# Patient Record
Sex: Female | Born: 1975 | Race: Black or African American | Hispanic: No | Marital: Single | Smoking: Never smoker
Health system: Southern US, Community
[De-identification: ages and names within clinical notes are randomized; demographics above are authoritative.]

## PROBLEM LIST (undated history)

## (undated) DIAGNOSIS — C73 Malignant neoplasm of thyroid gland: Secondary | ICD-10-CM

## (undated) DIAGNOSIS — G43909 Migraine, unspecified, not intractable, without status migrainosus: Secondary | ICD-10-CM

## (undated) DIAGNOSIS — E039 Hypothyroidism, unspecified: Secondary | ICD-10-CM

## (undated) DIAGNOSIS — D6800 Von Willebrand disease, unspecified: Secondary | ICD-10-CM

## (undated) HISTORY — PX: CHOLECYSTECTOMY: SHX55

## (undated) HISTORY — PX: THYROIDECTOMY: SHX17

---

## 1999-09-18 ENCOUNTER — Emergency Department (HOSPITAL_COMMUNITY): Admission: EM | Admit: 1999-09-18 | Discharge: 1999-09-18 | Payer: Self-pay | Admitting: Emergency Medicine

## 1999-09-18 ENCOUNTER — Encounter: Payer: Self-pay | Admitting: Emergency Medicine

## 1999-11-24 ENCOUNTER — Other Ambulatory Visit: Admission: RE | Admit: 1999-11-24 | Discharge: 1999-11-24 | Payer: Self-pay | Admitting: Obstetrics and Gynecology

## 1999-12-06 ENCOUNTER — Inpatient Hospital Stay (HOSPITAL_COMMUNITY): Admission: AD | Admit: 1999-12-06 | Discharge: 1999-12-09 | Payer: Self-pay | Admitting: *Deleted

## 2000-02-25 ENCOUNTER — Inpatient Hospital Stay (HOSPITAL_COMMUNITY): Admission: AD | Admit: 2000-02-25 | Discharge: 2000-02-25 | Payer: Self-pay | Admitting: *Deleted

## 2000-05-03 ENCOUNTER — Encounter: Payer: Self-pay | Admitting: Obstetrics and Gynecology

## 2000-05-03 ENCOUNTER — Inpatient Hospital Stay (HOSPITAL_COMMUNITY): Admission: AD | Admit: 2000-05-03 | Discharge: 2000-05-03 | Payer: Self-pay | Admitting: Obstetrics and Gynecology

## 2000-05-15 ENCOUNTER — Ambulatory Visit (HOSPITAL_COMMUNITY): Admission: RE | Admit: 2000-05-15 | Discharge: 2000-05-15 | Payer: Self-pay | Admitting: Obstetrics and Gynecology

## 2000-05-18 ENCOUNTER — Encounter (INDEPENDENT_AMBULATORY_CARE_PROVIDER_SITE_OTHER): Payer: Self-pay | Admitting: Specialist

## 2000-05-18 ENCOUNTER — Inpatient Hospital Stay (HOSPITAL_COMMUNITY): Admission: AD | Admit: 2000-05-18 | Discharge: 2000-05-23 | Payer: Self-pay | Admitting: Obstetrics and Gynecology

## 2000-05-31 ENCOUNTER — Encounter: Admission: RE | Admit: 2000-05-31 | Discharge: 2000-08-29 | Payer: Self-pay | Admitting: Obstetrics and Gynecology

## 2000-06-27 ENCOUNTER — Other Ambulatory Visit: Admission: RE | Admit: 2000-06-27 | Discharge: 2000-06-27 | Payer: Self-pay | Admitting: Ophthalmology

## 2001-07-12 ENCOUNTER — Encounter: Admission: RE | Admit: 2001-07-12 | Discharge: 2001-07-12 | Payer: Self-pay | Admitting: Internal Medicine

## 2001-07-12 ENCOUNTER — Encounter: Payer: Self-pay | Admitting: Internal Medicine

## 2002-02-27 ENCOUNTER — Emergency Department (HOSPITAL_COMMUNITY): Admission: EM | Admit: 2002-02-27 | Discharge: 2002-02-27 | Payer: Self-pay

## 2002-11-12 ENCOUNTER — Other Ambulatory Visit: Admission: RE | Admit: 2002-11-12 | Discharge: 2002-11-12 | Payer: Self-pay | Admitting: Internal Medicine

## 2003-07-24 ENCOUNTER — Emergency Department (HOSPITAL_COMMUNITY): Admission: EM | Admit: 2003-07-24 | Discharge: 2003-07-24 | Payer: Self-pay | Admitting: Emergency Medicine

## 2003-10-05 ENCOUNTER — Emergency Department (HOSPITAL_COMMUNITY): Admission: AD | Admit: 2003-10-05 | Discharge: 2003-10-05 | Payer: Self-pay | Admitting: Family Medicine

## 2003-11-10 ENCOUNTER — Emergency Department (HOSPITAL_COMMUNITY): Admission: AD | Admit: 2003-11-10 | Discharge: 2003-11-10 | Payer: Self-pay | Admitting: Family Medicine

## 2004-03-02 ENCOUNTER — Emergency Department (HOSPITAL_COMMUNITY): Admission: EM | Admit: 2004-03-02 | Discharge: 2004-03-02 | Payer: Self-pay | Admitting: Emergency Medicine

## 2004-07-22 ENCOUNTER — Other Ambulatory Visit: Admission: RE | Admit: 2004-07-22 | Discharge: 2004-07-22 | Payer: Self-pay | Admitting: Internal Medicine

## 2005-02-17 ENCOUNTER — Encounter (INDEPENDENT_AMBULATORY_CARE_PROVIDER_SITE_OTHER): Payer: Self-pay | Admitting: *Deleted

## 2005-02-17 ENCOUNTER — Ambulatory Visit (HOSPITAL_COMMUNITY): Admission: RE | Admit: 2005-02-17 | Discharge: 2005-02-17 | Payer: Self-pay | Admitting: Internal Medicine

## 2005-03-30 ENCOUNTER — Encounter: Admission: RE | Admit: 2005-03-30 | Discharge: 2005-03-30 | Payer: Self-pay | Admitting: Internal Medicine

## 2005-04-28 ENCOUNTER — Other Ambulatory Visit: Admission: RE | Admit: 2005-04-28 | Discharge: 2005-04-28 | Payer: Self-pay | Admitting: Obstetrics and Gynecology

## 2005-09-01 ENCOUNTER — Emergency Department (HOSPITAL_COMMUNITY): Admission: EM | Admit: 2005-09-01 | Discharge: 2005-09-01 | Payer: Self-pay | Admitting: Emergency Medicine

## 2005-10-27 ENCOUNTER — Emergency Department (HOSPITAL_COMMUNITY): Admission: EM | Admit: 2005-10-27 | Discharge: 2005-10-28 | Payer: Self-pay | Admitting: Emergency Medicine

## 2006-02-01 ENCOUNTER — Emergency Department (HOSPITAL_COMMUNITY): Admission: EM | Admit: 2006-02-01 | Discharge: 2006-02-01 | Payer: Self-pay | Admitting: *Deleted

## 2006-02-03 ENCOUNTER — Emergency Department (HOSPITAL_COMMUNITY): Admission: EM | Admit: 2006-02-03 | Discharge: 2006-02-03 | Payer: Self-pay | Admitting: Emergency Medicine

## 2006-02-07 ENCOUNTER — Other Ambulatory Visit: Admission: RE | Admit: 2006-02-07 | Discharge: 2006-02-07 | Payer: Self-pay | Admitting: Obstetrics and Gynecology

## 2006-02-12 ENCOUNTER — Emergency Department (HOSPITAL_COMMUNITY): Admission: EM | Admit: 2006-02-12 | Discharge: 2006-02-12 | Payer: Self-pay | Admitting: Emergency Medicine

## 2006-04-15 ENCOUNTER — Emergency Department (HOSPITAL_COMMUNITY): Admission: EM | Admit: 2006-04-15 | Discharge: 2006-04-15 | Payer: Self-pay | Admitting: Emergency Medicine

## 2006-04-16 ENCOUNTER — Emergency Department (HOSPITAL_COMMUNITY): Admission: EM | Admit: 2006-04-16 | Discharge: 2006-04-16 | Payer: Self-pay | Admitting: Emergency Medicine

## 2006-08-24 ENCOUNTER — Other Ambulatory Visit: Admission: RE | Admit: 2006-08-24 | Discharge: 2006-08-24 | Payer: Self-pay | Admitting: Obstetrics and Gynecology

## 2006-10-22 ENCOUNTER — Emergency Department (HOSPITAL_COMMUNITY): Admission: EM | Admit: 2006-10-22 | Discharge: 2006-10-22 | Payer: Self-pay | Admitting: Family Medicine

## 2007-06-22 ENCOUNTER — Emergency Department (HOSPITAL_COMMUNITY): Admission: EM | Admit: 2007-06-22 | Discharge: 2007-06-22 | Payer: Self-pay | Admitting: Emergency Medicine

## 2007-10-12 ENCOUNTER — Encounter: Admission: RE | Admit: 2007-10-12 | Discharge: 2007-10-12 | Payer: Self-pay | Admitting: Internal Medicine

## 2007-10-30 ENCOUNTER — Emergency Department (HOSPITAL_COMMUNITY): Admission: EM | Admit: 2007-10-30 | Discharge: 2007-10-30 | Payer: Self-pay | Admitting: Family Medicine

## 2007-12-16 ENCOUNTER — Emergency Department (HOSPITAL_COMMUNITY): Admission: EM | Admit: 2007-12-16 | Discharge: 2007-12-17 | Payer: Self-pay | Admitting: Emergency Medicine

## 2008-02-23 ENCOUNTER — Emergency Department (HOSPITAL_BASED_OUTPATIENT_CLINIC_OR_DEPARTMENT_OTHER): Admission: EM | Admit: 2008-02-23 | Discharge: 2008-02-23 | Payer: Self-pay | Admitting: Emergency Medicine

## 2008-06-12 ENCOUNTER — Emergency Department (HOSPITAL_BASED_OUTPATIENT_CLINIC_OR_DEPARTMENT_OTHER): Admission: EM | Admit: 2008-06-12 | Discharge: 2008-06-12 | Payer: Self-pay | Admitting: Emergency Medicine

## 2008-09-10 ENCOUNTER — Inpatient Hospital Stay (HOSPITAL_COMMUNITY): Admission: EM | Admit: 2008-09-10 | Discharge: 2008-09-13 | Payer: Self-pay | Admitting: Emergency Medicine

## 2008-09-10 ENCOUNTER — Ambulatory Visit (HOSPITAL_COMMUNITY): Admission: RE | Admit: 2008-09-10 | Discharge: 2008-09-10 | Payer: Self-pay | Admitting: Internal Medicine

## 2008-11-10 ENCOUNTER — Encounter (INDEPENDENT_AMBULATORY_CARE_PROVIDER_SITE_OTHER): Payer: Self-pay | Admitting: Surgery

## 2008-11-10 ENCOUNTER — Ambulatory Visit (HOSPITAL_COMMUNITY): Admission: RE | Admit: 2008-11-10 | Discharge: 2008-11-11 | Payer: Self-pay | Admitting: Surgery

## 2009-04-07 ENCOUNTER — Ambulatory Visit: Payer: Self-pay | Admitting: Diagnostic Radiology

## 2009-04-07 ENCOUNTER — Emergency Department (HOSPITAL_BASED_OUTPATIENT_CLINIC_OR_DEPARTMENT_OTHER): Admission: EM | Admit: 2009-04-07 | Discharge: 2009-04-07 | Payer: Self-pay | Admitting: Emergency Medicine

## 2009-08-04 ENCOUNTER — Emergency Department (HOSPITAL_BASED_OUTPATIENT_CLINIC_OR_DEPARTMENT_OTHER): Admission: EM | Admit: 2009-08-04 | Discharge: 2009-08-04 | Payer: Self-pay | Admitting: Emergency Medicine

## 2009-08-04 ENCOUNTER — Ambulatory Visit: Payer: Self-pay | Admitting: Diagnostic Radiology

## 2010-04-14 ENCOUNTER — Emergency Department (HOSPITAL_BASED_OUTPATIENT_CLINIC_OR_DEPARTMENT_OTHER): Admission: EM | Admit: 2010-04-14 | Discharge: 2010-04-14 | Payer: Self-pay | Admitting: Emergency Medicine

## 2010-04-25 ENCOUNTER — Emergency Department (HOSPITAL_BASED_OUTPATIENT_CLINIC_OR_DEPARTMENT_OTHER): Admission: EM | Admit: 2010-04-25 | Discharge: 2010-04-25 | Payer: Self-pay | Admitting: Emergency Medicine

## 2010-11-04 LAB — DIFFERENTIAL
Eosinophils Relative: 1 % (ref 0–5)
Lymphs Abs: 2 10*3/uL (ref 0.7–4.0)
Monocytes Relative: 10 % (ref 3–12)
Neutro Abs: 3.4 10*3/uL (ref 1.7–7.7)
Neutrophils Relative %: 56 % (ref 43–77)

## 2010-11-04 LAB — CBC
HCT: 32.1 % — ABNORMAL LOW (ref 36.0–46.0)
MCHC: 34.2 g/dL (ref 30.0–36.0)
RBC: 3.88 MIL/uL (ref 3.87–5.11)
RDW: 15.4 % (ref 11.5–15.5)
WBC: 6.3 10*3/uL (ref 4.0–10.5)

## 2010-11-04 LAB — URINALYSIS, ROUTINE W REFLEX MICROSCOPIC
Bilirubin Urine: NEGATIVE
Hgb urine dipstick: NEGATIVE
Nitrite: NEGATIVE
Specific Gravity, Urine: 1.04 — ABNORMAL HIGH (ref 1.005–1.030)
Urobilinogen, UA: 1 mg/dL (ref 0.0–1.0)

## 2010-11-04 LAB — BASIC METABOLIC PANEL
BUN: 11 mg/dL (ref 6–23)
CO2: 27 mEq/L (ref 19–32)
Creatinine, Ser: 0.9 mg/dL (ref 0.4–1.2)
GFR calc Af Amer: >60 mL/min (ref >60)
GFR calc non Af Amer: >60 mL/min (ref >60)
Glucose, Bld: 88 mg/dL (ref 70–99)
Potassium: 3.9 mEq/L (ref 3.5–5.1)
Sodium: 141 mEq/L (ref 135–145)

## 2010-11-04 LAB — URINE MICROSCOPIC-ADD ON

## 2010-11-23 LAB — WET PREP, GENITAL: Yeast Wet Prep HPF POC: NONE SEEN

## 2010-11-23 LAB — CBC
HCT: 32.6 % — ABNORMAL LOW (ref 36.0–46.0)
Hemoglobin: 11 g/dL — ABNORMAL LOW (ref 12.0–15.0)
RBC: 3.97 MIL/uL (ref 3.87–5.11)
RDW: 15.4 % (ref 11.5–15.5)

## 2010-11-23 LAB — DIFFERENTIAL
Basophils Absolute: 0 10*3/uL (ref 0.0–0.1)
Eosinophils Relative: 1 % (ref 0–5)
Lymphocytes Relative: 29 % (ref 12–46)
Monocytes Absolute: 0.4 10*3/uL (ref 0.1–1.0)
Monocytes Relative: 6 % (ref 3–12)

## 2010-11-23 LAB — URINALYSIS, ROUTINE W REFLEX MICROSCOPIC
Protein, ur: NEGATIVE mg/dL
Urobilinogen, UA: 0.2 mg/dL (ref 0.0–1.0)

## 2010-11-23 LAB — BASIC METABOLIC PANEL
CO2: 26 mEq/L (ref 19–32)
GFR calc non Af Amer: >60 mL/min (ref >60)
Glucose, Bld: 81 mg/dL (ref 70–99)
Potassium: 3.9 mEq/L (ref 3.5–5.1)
Sodium: 140 mEq/L (ref 135–145)

## 2010-11-23 LAB — GC/CHLAMYDIA PROBE AMP, GENITAL: GC Probe Amp, Genital: NEGATIVE

## 2010-11-27 LAB — GC/CHLAMYDIA PROBE AMP, GENITAL: GC Probe Amp, Genital: NEGATIVE

## 2010-11-27 LAB — DIFFERENTIAL
Basophils Relative: 1 % (ref 0–1)
Eosinophils Absolute: 0.1 10*3/uL (ref 0.0–0.7)
Eosinophils Relative: 2 % (ref 0–5)
Lymphs Abs: 2.6 10*3/uL (ref 0.7–4.0)
Monocytes Relative: 5 % (ref 3–12)
Neutrophils Relative %: 32 % — ABNORMAL LOW (ref 43–77)

## 2010-11-27 LAB — WET PREP, GENITAL: Trich, Wet Prep: NONE SEEN

## 2010-11-27 LAB — URINALYSIS, ROUTINE W REFLEX MICROSCOPIC
Bilirubin Urine: NEGATIVE
Glucose, UA: NEGATIVE mg/dL
Specific Gravity, Urine: 1.016 (ref 1.005–1.030)
pH: 7 (ref 5.0–8.0)

## 2010-11-27 LAB — COMPREHENSIVE METABOLIC PANEL
ALT: <6 U/L (ref 0–35)
AST: 18 U/L (ref 0–37)
Alkaline Phosphatase: 84 U/L (ref 39–117)
CO2: 28 mEq/L (ref 19–32)
GFR calc Af Amer: >60 mL/min (ref >60)
GFR calc non Af Amer: >60 mL/min (ref >60)
Glucose, Bld: 112 mg/dL — ABNORMAL HIGH (ref 70–99)
Potassium: 3.5 mEq/L (ref 3.5–5.1)
Sodium: 139 mEq/L (ref 135–145)

## 2010-11-27 LAB — LIPASE, BLOOD: Lipase: 85 U/L (ref 23–300)

## 2010-11-27 LAB — URINE MICROSCOPIC-ADD ON

## 2010-11-27 LAB — CBC
Hemoglobin: 11.6 g/dL — ABNORMAL LOW (ref 12.0–15.0)
RBC: 4.27 MIL/uL (ref 3.87–5.11)
WBC: 4.3 10*3/uL (ref 4.0–10.5)

## 2010-12-02 LAB — PREGNANCY, URINE: Preg Test, Ur: NEGATIVE

## 2010-12-02 LAB — COMPREHENSIVE METABOLIC PANEL
ALT: 15 U/L (ref 0–35)
AST: 16 U/L (ref 0–37)
Albumin: 3.5 g/dL (ref 3.5–5.2)
Alkaline Phosphatase: 75 U/L (ref 39–117)
CO2: 29 mEq/L (ref 19–32)
Chloride: 101 mEq/L (ref 96–112)
Creatinine, Ser: 0.68 mg/dL (ref 0.4–1.2)
GFR calc Af Amer: >60 mL/min (ref >60)
GFR calc non Af Amer: >60 mL/min (ref >60)
Potassium: 3.7 mEq/L (ref 3.5–5.1)
Total Bilirubin: 0.1 mg/dL — ABNORMAL LOW (ref 0.3–1.2)

## 2010-12-02 LAB — DIFFERENTIAL
Basophils Absolute: 0 10*3/uL (ref 0.0–0.1)
Basophils Relative: 1 % (ref 0–1)
Eosinophils Absolute: 0.1 10*3/uL (ref 0.0–0.7)
Eosinophils Relative: 2 % (ref 0–5)
Lymphocytes Relative: 34 % (ref 12–46)
Monocytes Absolute: 0.5 10*3/uL (ref 0.1–1.0)

## 2010-12-02 LAB — CBC
MCV: 84.5 fL (ref 78.0–100.0)
Platelets: 231 10*3/uL (ref 150–400)
RBC: 4.04 MIL/uL (ref 3.87–5.11)
WBC: 5.3 10*3/uL (ref 4.0–10.5)

## 2010-12-02 LAB — PROTIME-INR: Prothrombin Time: 13.3 seconds (ref 11.6–15.2)

## 2010-12-06 LAB — HCG, QUANTITATIVE, PREGNANCY: hCG, Beta Chain, Quant, S: 104541 m[IU]/mL — ABNORMAL HIGH (ref <5)

## 2010-12-06 LAB — DIFFERENTIAL
Basophils Absolute: 0 10*3/uL (ref 0.0–0.1)
Basophils Relative: 1 % (ref 0–1)
Lymphocytes Relative: 31 % (ref 12–46)
Monocytes Relative: 10 % (ref 3–12)
Neutro Abs: 2.9 10*3/uL (ref 1.7–7.7)
Neutrophils Relative %: 58 % (ref 43–77)

## 2010-12-06 LAB — CBC
HCT: 31.8 % — ABNORMAL LOW (ref 36.0–46.0)
HCT: 37.2 % (ref 36.0–46.0)
Hemoglobin: 10.7 g/dL — ABNORMAL LOW (ref 12.0–15.0)
Hemoglobin: 12.1 g/dL (ref 12.0–15.0)
MCHC: 33.5 g/dL (ref 30.0–36.0)
MCV: 83.6 fL (ref 78.0–100.0)
MCV: 84.2 fL (ref 78.0–100.0)
Platelets: 203 10*3/uL (ref 150–400)
Platelets: 229 10*3/uL (ref 150–400)
RBC: 3.81 MIL/uL — ABNORMAL LOW (ref 3.87–5.11)
RDW: 14.7 % (ref 11.5–15.5)
RDW: 15.3 % (ref 11.5–15.5)
WBC: 3.5 10*3/uL — ABNORMAL LOW (ref 4.0–10.5)

## 2010-12-06 LAB — WET PREP, GENITAL
Trich, Wet Prep: NONE SEEN
Yeast Wet Prep HPF POC: NONE SEEN

## 2010-12-06 LAB — COMPREHENSIVE METABOLIC PANEL
AST: 20 U/L (ref 0–37)
Albumin: 2.8 g/dL — ABNORMAL LOW (ref 3.5–5.2)
Alkaline Phosphatase: 70 U/L (ref 39–117)
Alkaline Phosphatase: 90 U/L (ref 39–117)
BUN: 6 mg/dL (ref 6–23)
CO2: 25 mEq/L (ref 19–32)
Chloride: 106 mEq/L (ref 96–112)
Creatinine, Ser: 0.74 mg/dL (ref 0.4–1.2)
Creatinine, Ser: 0.74 mg/dL (ref 0.4–1.2)
GFR calc Af Amer: >60 mL/min (ref >60)
GFR calc non Af Amer: >60 mL/min (ref >60)
Glucose, Bld: 82 mg/dL (ref 70–99)
Potassium: 3.5 mEq/L (ref 3.5–5.1)
Potassium: 3.8 mEq/L (ref 3.5–5.1)
Total Bilirubin: 0.4 mg/dL (ref 0.3–1.2)
Total Bilirubin: 1.1 mg/dL (ref 0.3–1.2)
Total Protein: 7.3 g/dL (ref 6.0–8.3)

## 2010-12-06 LAB — URINE MICROSCOPIC-ADD ON

## 2010-12-06 LAB — ABO/RH: ABO/RH(D): O POS

## 2010-12-06 LAB — URINALYSIS, ROUTINE W REFLEX MICROSCOPIC
Glucose, UA: NEGATIVE mg/dL
Hgb urine dipstick: NEGATIVE
Ketones, ur: 15 mg/dL — AB
Protein, ur: 30 mg/dL — AB

## 2010-12-06 LAB — GC/CHLAMYDIA PROBE AMP, GENITAL: GC Probe Amp, Genital: NEGATIVE

## 2010-12-06 LAB — POCT PREGNANCY, URINE: Preg Test, Ur: POSITIVE

## 2011-01-04 NOTE — Discharge Summary (Signed)
[name], [name]                ACCOUNT NO.:  [id number]   MEDICAL RECORD NO.:  [id number]          PATIENT TYPE:  INP   LOCATION:  5151                         FACILITY:  [facility]   PHYSICIAN:  Kristen Boyd, M.D.DATE OF BIRTH:  1976-05-04   DATE OF ADMISSION:  09/10/2008  DATE OF DISCHARGE:  09/13/2008                               DISCHARGE SUMMARY   ADMITTING PHYSICIAN:  Kristen Arms. Corliss Skains, MD   DISCHARGING PHYSICIAN:  Kristen Pu. Cornett, MD   CHIEF COMPLAINT AND REASON FOR ADMISSION:  Ms. Kristen Boyd is a 32-year female  patient who recently diagnosed as being pregnant, gestational age of the  fetus is 7-1/2 weeks, has had right upper quadrant pain for several  months usually after eating especially a spicy foods, intermittently  associated with nausea and vomiting.  She has not had a prior workup.  She presented to the ER with several days of persistent pain and nausea  and vomiting.  An ultrasound done on the ER revealed distended  gallbladder with internal echoes, possible sludge, no wall thickening,  and normal common bile duct.  LFTs were normal.  Other labs were normal  including lipase.  Her serum hCG was greater than 10,000 consistent with  pregnancy.  The patient was admitted with the following diagnoses.  1. Biliary colic.  2. First trimester pregnancy.  3. History of suspected volume depletion secondary to nausea and      vomiting.   HOSPITAL COURSE:  The patient was admitted and was placed empirically on  IV cefoxitin, which was okayed by pharmacist for first trimester  pregnancy.  She was placed on n.p.o. status and IV fluids and pain  management.  Throughout the hospitalization, the patient's labs remained  normal.  She continue with difficulty with right upper quadrant pain and  nausea.  Some of the nausea was felt to be more related to first  trimester pregnancy.  Because she has a first trimester pregnancy, she  was not a candidate for operative intervention at  this time.  Per the  patient request and per surgeon's request, the residents with the [facility] will call, they were overwhelmed in hospital.  The  patient is unassigned regarding her GYN care and is not followed up here  with a gynecologist or obstetrician.  I spoke with them and then later  Kristen Boyd spoke with Kristen Boyd and when then she felt at this  point yes it is appropriate to hold off on surgical intervention because  the patient is first trimester.  There are no signs and symptoms based  on what he has reviewed and he was explained to him per the physician to  indicate the patient needs to terminate the pregnancy because of this  illness and otherwise, she can follow up with the preferred  obstetrician, gynecologist after discharge or she may follow up with  them, numbers have been given.   By September 13, 2008, the patient was doing well, was tolerating a low-  fat diet, although she was still having some nausea which again was felt  more related  to the pregnancy and not to biliary colic.  Plans were for  the patient to follow up with Kristen Boyd in 1-2 weeks and follow up her  private physician Kristen Boyd and establish with an obstetrician,  gynecologist for further management of the pregnancy.  In addition, the  patient is a high-risk pregnancy and potential high-risk surgery because  she has a history of von Willebrand disease and therefore does not take  aspirin products accordingly.   FINAL DISCHARGE DIAGNOSES:  1. Biliary colic, resolving with ultrasound consistent with possible      sludge, otherwise normal ultrasound.  2. First trimester pregnancy, 8 weeks fetal gestational age at time of      discharge.   DISCHARGE MEDICATIONS:  The patient was not on home medications.  1. She will go home on Percocet 5/325 one to two tablets every 4 hours      as needed for pain.  2. Zofran 4 mg every 4-6 hours as needed for nausea.   OTHER INSTRUCTIONS:   Low-fat diet, hydrate as much as possible.   FOLLOWUP:  Need to call Kristen Boyd office at  [phone], to be seen in 1-  2 weeks.  She needs to see Kristen Boyd in 1-2 weeks and she needs to  follow up with either the ER OB/GYN or contact a local office to  establish obstetrical because of her pregnancy.      Kristen Boyd, N.P.      Kristen Boyd, M.D.  Electronically Signed    ALE/MEDQ  D:  10/27/2008  T:  10/27/2008  Job:  [id number]   cc:   Kristen Boyd, M.D.  Kristen Housekeeper, MD

## 2011-01-04 NOTE — Op Note (Signed)
Kristen Boyd, Kristen Boyd                ACCOUNT NO.:  [id number]   MEDICAL RECORD NO.:  [id number]          PATIENT TYPE:  OIB   LOCATION:  1524                         FACILITY:  [facility]   PHYSICIAN:  Wilmon Arms. Corliss Skains, M.D. DATE OF BIRTH:  12/12/1975   DATE OF PROCEDURE:  11/10/2008  DATE OF DISCHARGE:                               OPERATIVE REPORT   PREOPERATIVE DIAGNOSIS:  Chronic cholecystitis.   POSTOPERATIVE DIAGNOSIS:  Chronic cholecystitis.   PROCEDURE PERFORMED:  Laparoscopic cholecystectomy with intraoperative  cholangiogram.   SURGEON:  Wilmon Arms. Corliss Skains, M.D.   ASSISTANT:  Anselm Pancoast. Zachery Dakins, M.D.   ANESTHESIA:  General endotracheal.   INDICATIONS:  The patient is a 35 year old female who was initially  admitted to [facility] on 08/2008 with biliary colic.  An ultrasound  showed some gallbladder sludge but no wall thickening.  She has symptoms  after eating greasy spicy foods associated nausea and vomiting.  At the  time of her admission, she was found to be pregnant.  Her symptoms  improved on antibiotics.  She has subsequently terminated the pregnancy.  She presents now for laparoscopic cholecystectomy.  Of note, the patient  does have Von Willebrand's disease and was given DDAVP just prior to her  procedures today.   DESCRIPTION OF PROCEDURE:  The patient brought to the operating placed  in supine position on operating table.  After an adequate level of  general anesthesia was obtained, the patient's abdomen was prepped with  Betadine and draped in sterile fashion.  A time-out was taken assure  proper patient, proper procedure.  The area below umbilicus infiltrated  with 0.25% Marcaine with epinephrine.  She has a previous scar here.  Dissection was carried down through the subcutaneous tissues to the  fascia.  The fascia was grasped with Kocher clamps and elevated.  We  divided the fascia vertically.  We entered the peritoneal cavity  bluntly.  A stay suture of 0  Vicryl was placed around the fascial  opening.  The pneumoperitoneum is obtained by insufflating CO2,  maintaining maximal pressure of 15 mmHg.  Laparoscope was inserted and  the patient was positioned in reverse Trendelenburg tilted to her left.  An 11-mm port was placed in subxiphoid position.  Two 5 mm ports placed  in right upper quadrant.  The gallbladder grasped with clamp and  elevated over the edge of the liver.  There were lot of omental  adhesions to the surface of gallbladder.  These were taken down with  sharp dissection as well as cautery.  A little bit of the capsule of the  liver was inadvertently stripped away but this was cauterized for  hemostasis.  We were able to expose the hilum of gallbladder.  I bluntly  dissected around the cystic duct and cystic artery.  The cystic artery  was ligated with clips.  The cystic duct was ligated, clipped distally.  A small opening was created on side of the cystic duct.  A Cook  cholangiogram catheter was then inserted through a stab incision and  threaded into the cystic duct.  This was  secured with a clip.  A  cholangiogram was obtained which showed good flow proximally and  distally in the biliary tree with no sign of filling defect or  obstruction.  Contrast flowed easily into duodenum.  The catheter was  removed the cystic duct was ligated clips and divided.  The cystic  artery was divided.  A small posterior branch was also identified and  was ligated with clips and divided.  Cautery was then used to remove the  gallbladder from the liver bed.  We carefully inspected the gallbladder  fossa for hemostasis.  We thoroughly irrigated the right upper quadrant  and suctioned out as much as possible.  The gallbladder was placed in an  EndoCatch sac and removed through the umbilical port site.  The fascia  was closed with a pursestring suture.  We again inspected the right  upper quadrant.  Pneumoperitoneum is then released as we removed  the  trocars.  4-0 Monocryl was used to close skin incisions.  Steri-Strips  and clean dressings were applied.  The patient was then extubated and  brought to recovery room in stable condition.  All sponge, instrument  and needle counts were correct.      Wilmon Arms. Tsuei, M.D.  Electronically Signed     MKT/MEDQ  D:  11/10/2008  T:  11/10/2008  Job:  [id number]

## 2011-01-07 NOTE — Discharge Summary (Signed)
[facility]  Patient:    Kristen Boyd, Kristen Boyd                       MRN: [id number] Adm. Date:  [id number] Disc. Date: [id number] Attending:  Donne Hazel                           Discharge Summary  ADDITIONAL DIAGNOSES:         1. Intrauterine pregnancy, at 12 weeks estimated                                  gestational age.                               2. Hyperemesis gravida derm.  REASON FOR ADMISSION:         This patient is a 35 year old black female, G1, P0, at 83 weeks estimated gestational age, who has had recurrent problems with nausea and vomiting.  At the time of admission she has been refractory to outpatient management.  She also has a history of von Willebrands disease.  HOSPITAL COURSE:              The patient was admitted to the hospital and underwent IV hydration.  She was found to have headaches on Reglan.  She was then switched to IV Zofran which gives good results.  On the day of discharge, she is feeling well using oral Zofran as needed.  She is eating a full breakfast tray n the day of discharge without complaint.  CONDITION ON DISCHARGE:       Good.  DIET:                         Regular as tolerated.  The patient was given careful instructions to maintain at least a small amount of fluid and food on her stomach every two three hours.  She is to call for any recurrent persistent nausea, vomiting, vaginal bleeding, headache, or other problems.  MEDICATIONS:                  1. Zofran 4 mg, #10, one refill one p.o. b.i.d.                                  p.r.n.                               2. Prenatal vitamins daily.  FOLLOWUP:                     Followup is in the office as scheduled in the next two to three weeks.  LABORATORY DATA:              On April 17, urinalysis is essentially negative except for a mildly elevated urobilinogen.  A trace leukocyte esterase is also noted. DD:  12/09/99 TD:   12/09/99 Job: [id number]

## 2011-01-07 NOTE — Discharge Summary (Signed)
[facility]  Patient:    Kristen Boyd, Kristen Boyd                       MRN: [id number] Adm. Date:  [id number] Disc. Date: [id number] Attending:  Trevor Iha Dictator:   Danie Chandler, R.N.                           Discharge Summary  ADMISSION DIAGNOSES:          Intrauterine pregnancy at [redacted] weeks  gestation for induction of labor due to pregnancy-induced hypertension with mature amniotic fluid.  DISCHARGE DIAGNOSES:          1. Intrauterine pregnancy at [redacted] weeks gestation                                  for induction of labor due to                                  pregnancy-induced hypertension with mature                                  amniotic fluid.                               2. Failed induction.  PROCEDURES:                   On May 19, 2000, primary low cervical cesarean section.  REASON FOR ADMISSION:         The patient is a 35 year old, primigravida married black female  who was admitted at 36 weeks for the induction of labor.  The indications were pregnancy-induced hypertension. The patient was begun on the induction.  She had a complete lack of progression all day.  Her liver function tests performed showed an SGOT and SGPT that were slowly rising.  The last values were and SGOT 51, SGPT 57.  In view of the rise in liver function tests and lack of progression, decision was made to proceed with primary cesarean section.  HOSPITAL COURSE:              The patient was taken to the operating room and underwent the above-named procedure without complication.  This was productive of a viable female infant with Apgars of 8 at one minute and 9 at five minutes and an arterial cord pH of 7.32.  Postoperatively, the patient did well.  She was on magnesium sulfate.  On postoperative day #1, the patients hemoglobin was 9.7, hematocrit 28.8, and white blood cell count 8.1.  Her platelets were 191,000.  Her liver function tests no this day  showed an SGOT of 52, SGPT 61.  The patient was continued on magnesium sulfate for 24 hours, and on postoperatively day #2, she was without complaint.  Her vital signs were stable.  She had good return of bowel function and was tolerating a regular diet.  She was also ambulating well without difficulty and had good pain control.  Her deep tendon reflexes were 2+, and her liver function tests on this day continued to decrease.  On postoperative day #3, the patient had some  swelling in her feet.  She had no signs or symptoms of PIH.  Her deep tendon reflexes were 1+ with no clonus. SGOT on this day was 71, SGPT 67, uric acid 5.5, and LDH 233.  The patient was kept until the following day during which time her liver function tests were repeated.  On postoperative day #4, the patient still was without complaint. She denied any PIH signs or symptoms.  Her blood pressure was stable.  Her liver function tests were improving, and she was discharged to home.  CONDITION ON DISCHARGE:       Good.  DIET:                         Regular as tolerated.  ACTIVITY:                     No heavy lifting, no driving, no vaginal entry.  FOLLOWUP:                     She is to follow up in the office in one week for incision check, and she is to call for temperature greater than 100 degrees, persistent nausea or vomiting, heavy vaginal bleeding and/or redness or drainage from the incision site as well as any PIH signs or symptoms.  DISCHARGE MEDICATIONS:        Prenatal vitamin 1 p.o. q.d. and pain medications as directed by M.D. DD:  06/14/00 TD:  06/14/00 Job: [id number]

## 2011-01-07 NOTE — Op Note (Signed)
[facility]  Patient:    Kristen Boyd, Kristen Boyd                       MRN: [id number] Proc. Date: 05/19/00 Adm. Date:  [id number] Attending:  Trevor Iha                           Operative Report  PREOPERATIVE DIAGNOSES:       Uterine pregnancy at 36 weeks, with severe                               pregnancy-induced hypertension, failed                               induction.  POSTOPERATIVE DIAGNOSES:      Uterine pregnancy at 36 weeks, with severe                               pregnancy-induced hypertension, failed                               induction.  OPERATION:                    Low transverse cesarean section.  SURGEON:                      Juluis Mire, M.D.  ANESTHESIA:                   Spinal.  ESTIMATED BLOOD LOSS:         800 cc.  PACKS AND DRAINS:             None.  INTRAOPERATIVE BLOOD REPLACEMENT:  None.  COMPLICATIONS:                None.  INDICATIONS:                  This 35 year old primigravida married black female was admitted at 36 weeks for the induction of labor.  The indications were pregnancy-induced hypertension.  She had had a previous amniocentesis revealing an LS of 2.3:1.0, without PEG.  A questionable history of von Willebrands disease.  The patient was begun on induction.  She had a complete lack of progression all day.  Her liver function tests performed with SGOT and SGPT were slowly rising.  The last values were an SGOT of 51, SGPT 57.  In view of the rise in the liver function tests, and lack of progression, we decided to proceed with a primary cesarean section.  The risks were discussed, including the risks of infection, the risk of hemorrhage which could necessitate transfusion, with the risks of AIDS or hepatitis, the risks of injury to adjacent organs including the bladder, bowel, or ureters which could require further exploratory surgery, the risk of deep vein thrombosis or pulmonary  emboli.  DESCRIPTION OF PROCEDURE:     The patient was taken to the operating room and placed in the supine position with a left lateral tilt.  After a satisfactory level of spinal anesthesia was obtained, the abdomen was prepped out with Betadine and draped as a sterile field.  A low transverse skin  incision was made with a knife and carried through the subcutaneous tissue.  The anterior rectus fascia was identified and entered sharply, and the incision in the fascia was extended laterally.  The fascia was taken off of the muscles superiorly and inferiorly.  The rectus muscles were then separated in the midline.  The peritoneum was entered sharply and the incision in the peritoneum was extended both superiorly and inferiorly.  A low transverse bladder flap was developed.  A low transverse uterine incision was begun with a knife and extended laterally using manual traction.  The amniotic fluid was clear.  The infant presented in the vertex presentation.  It was delivered with elevation of the head and fundal pressure.  The infant was a viable female who weighed 4 pounds 7 ounces, with Apgars of 8 and 9.  The umbilical artery pH was 7.32.  The placenta was then delivered manually and sent for pathologic review.  The uterus was wiped free of membranes and placenta.  The uterus was then closed with running locking sutures of #0 chromic, using a two-layer closure technique.  Hemostasis was excellent.  The tubes and ovaries were unremarkable.  It was noted that urine output was slightly blood-tinged on arrival to the operating room.  It remained so throughout the case, but did not worsen.  Indeed the urine appeared to be clearing in the tube.  The Foley catheter was irrigated.  Hemostasis was excellent.  The muscles were reapproximated with simple running suture of #3-0 Vicryl.  The fascia was closed with a running suture of #0 PDS.  The subcutaneous was closed with running suture of #3-0 Vicryl,  and the skin was closed with staples and Steri-Strips.  The sponge, instrument, and needle counts were reported as correct by the circulating nurse x 2.  The patient tolerated the procedure well and was returned to the recovery room in good condition. DD:  05/19/00 TD:  05/20/00 Job: [id number]

## 2011-05-24 LAB — DIFFERENTIAL
Basophils Absolute: 0.1
Eosinophils Relative: 1
Lymphocytes Relative: 28
Lymphs Abs: 1.8
Monocytes Absolute: 0.5
Neutro Abs: 4.1

## 2011-05-24 LAB — URINALYSIS, ROUTINE W REFLEX MICROSCOPIC
Bilirubin Urine: NEGATIVE
Glucose, UA: NEGATIVE
Hgb urine dipstick: NEGATIVE
Ketones, ur: NEGATIVE
Protein, ur: NEGATIVE
Urobilinogen, UA: 1

## 2011-05-24 LAB — COMPREHENSIVE METABOLIC PANEL
AST: 22
Albumin: 4.4
BUN: 9
Calcium: 9.3
Chloride: 102
Creatinine, Ser: 0.8
GFR calc Af Amer: >60
Total Bilirubin: 0.4
Total Protein: 7.9

## 2011-05-24 LAB — CBC
HCT: 35.6 — ABNORMAL LOW
MCHC: 34.1
MCV: 82.1
Platelets: 281
RDW: 13.7
WBC: 6.5

## 2011-09-29 ENCOUNTER — Emergency Department (HOSPITAL_COMMUNITY)
Admission: EM | Admit: 2011-09-29 | Discharge: 2011-09-29 | Disposition: A | Discharge status: Home / Self Care | Payer: Self-pay | Attending: Emergency Medicine | Admitting: Emergency Medicine

## 2011-09-29 ENCOUNTER — Encounter (HOSPITAL_COMMUNITY): Payer: Self-pay | Admitting: Emergency Medicine

## 2011-09-29 DIAGNOSIS — R197 Diarrhea, unspecified: Secondary | ICD-10-CM | POA: Insufficient documentation

## 2011-09-29 DIAGNOSIS — R112 Nausea with vomiting, unspecified: Secondary | ICD-10-CM | POA: Insufficient documentation

## 2011-09-29 HISTORY — DX: Von Willebrand's disease: D68.0

## 2011-09-29 HISTORY — DX: Von Willebrand disease, unspecified: D68.00

## 2011-09-29 LAB — PREGNANCY, URINE: Preg Test, Ur: NEGATIVE

## 2011-09-29 MED ORDER — ONDANSETRON HCL 4 MG/2ML IJ SOLN
4.0000 mg | Freq: Once | INTRAMUSCULAR | Status: AC
  Administered 2011-09-29: 4 mg via INTRAVENOUS
  Filled 2011-09-29: qty 2

## 2011-09-29 MED ORDER — SODIUM CHLORIDE 0.9 % IV BOLUS (SEPSIS)
1000.0000 mL | Freq: Once | INTRAVENOUS | Status: AC
  Administered 2011-09-29: 1000 mL via INTRAVENOUS

## 2011-09-29 MED ORDER — ONDANSETRON HCL 4 MG PO TABS: 4.0000 mg | ORAL_TABLET | Freq: Four times a day (QID) | ORAL | Status: AC

## 2011-09-29 NOTE — ED Notes (Signed)
MD at bedside. Dr. Kohut at bedside.  

## 2011-09-29 NOTE — ED Notes (Signed)
Pt obtaining urine sample at this time. Pt complains of nausea only when moving. States when she is laying down she feels ok

## 2011-09-29 NOTE — ED Notes (Signed)
Pt states she has had nausea,vomiting, and diarrhea for the past 13 hrs  Pt states only has pain when she vomits

## 2011-09-29 NOTE — ED Provider Notes (Signed)
History    36 year old female with nausea, vomiting and diarrhea. Onset was less than a day ago. Multiple sick contacts recently. Chills. No fever. Mild crampy, diffuse abdominal pain. No urinary complaints. No unusual vaginal bleeding or discharge. History of C-section otherwise no abdominal or pelvic surgeries. Patient has a history of von Willebrand's disease but is otherwise healthy.  CSN: [id number]  Arrival date & time 09/29/11  0038   First MD Initiated Contact with Patient 09/29/11 0107      Chief Complaint  Patient presents with  . Emesis    (Consider location/radiation/quality/duration/timing/severity/associated sxs/prior treatment) HPI  Past Medical History  Diagnosis Date  . Von Willebrand disease     Past Surgical History  Procedure Date  . Cesarean section     Family History  Problem Relation Age of Onset  . Hypertension Other   . Diabetes Other   . Cancer Other     History  Substance Use Topics  . Smoking status: Never Smoker   . Smokeless tobacco: Not on file  . Alcohol Use: Yes     rarely    OB History    Grav Para Term Preterm Abortions TAB SAB Ect Mult Living                  Review of Systems   Review of symptoms negative unless otherwise noted in HPI.   Allergies  Dilaudid and Penicillins  Home Medications  No current outpatient prescriptions on file.  BP 128/71  Pulse 111  Temp(Src) 97.8 F (36.6 C) (Oral)  Resp 18  SpO2 98%  LMP 09/26/2011  Physical Exam  Nursing note and vitals reviewed. Constitutional: She appears well-developed and well-nourished. No distress.       Laying in bed with her small child. No acute distress. Obese.  HENT:  Head: Normocephalic and atraumatic.  Eyes: Conjunctivae are normal. Pupils are equal, round, and reactive to light. Right eye exhibits no discharge. Left eye exhibits no discharge.  Neck: Neck supple.  Cardiovascular: Normal rate, regular rhythm and normal heart sounds.  Exam reveals no  gallop and no friction rub.   No murmur heard. Pulmonary/Chest: Effort normal and breath sounds normal. No respiratory distress.  Abdominal: Soft. She exhibits no distension. There is no tenderness.       Abdominal exam is benign.  Genitourinary:       No costovertebral angle tenderness.  Musculoskeletal: She exhibits no edema and no tenderness.  Neurological: She is alert.  Skin: Skin is warm and dry.  Psychiatric: She has a normal mood and affect. Her behavior is normal. Thought content normal.    ED Course  Procedures (including critical care time)   Labs Reviewed  PREGNANCY, URINE   No results found.  3:36 AM Patient reassessed prior discharge. Abdominal exam remains benign. Still with some nausea but has not vomited throughout ED stay.  1. Nausea and vomiting   2. Diarrhea       MDM  36 year old female with nausea vomiting and diarrhea. Some induration less than 24 hours. Abdominal exam is benign. Suspect viral illness. Very low suspicion for emergent abdominal process. continue symptomatic treatment. Return precautions discussed. Outpatient followup as needed.        Raeford Razor, MD 09/29/11 [phone]

## 2012-01-07 ENCOUNTER — Emergency Department (HOSPITAL_BASED_OUTPATIENT_CLINIC_OR_DEPARTMENT_OTHER)
Admission: EM | Admit: 2012-01-07 | Discharge: 2012-01-07 | Disposition: A | Discharge status: Home / Self Care | Payer: No Typology Code available for payment source | Attending: Emergency Medicine | Admitting: Emergency Medicine

## 2012-01-07 ENCOUNTER — Encounter (HOSPITAL_BASED_OUTPATIENT_CLINIC_OR_DEPARTMENT_OTHER): Payer: Self-pay | Admitting: *Deleted

## 2012-01-07 DIAGNOSIS — Y92410 Unspecified street and highway as the place of occurrence of the external cause: Secondary | ICD-10-CM | POA: Insufficient documentation

## 2012-01-07 DIAGNOSIS — IMO0001 Reserved for inherently not codable concepts without codable children: Secondary | ICD-10-CM | POA: Insufficient documentation

## 2012-01-07 DIAGNOSIS — M549 Dorsalgia, unspecified: Secondary | ICD-10-CM | POA: Insufficient documentation

## 2012-01-07 DIAGNOSIS — T148XXA Other injury of unspecified body region, initial encounter: Secondary | ICD-10-CM
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
## 2012-03-14 ENCOUNTER — Encounter (HOSPITAL_COMMUNITY): Payer: Self-pay | Admitting: Emergency Medicine

## 2012-03-14 ENCOUNTER — Emergency Department (HOSPITAL_COMMUNITY)
Admission: EM | Admit: 2012-03-14 | Discharge: 2012-03-14 | Discharge status: Against Medical Advice | Payer: No Typology Code available for payment source | Attending: Emergency Medicine | Admitting: Emergency Medicine

## 2012-03-14 DIAGNOSIS — R109 Unspecified abdominal pain: Secondary | ICD-10-CM | POA: Insufficient documentation

## 2012-03-14 DIAGNOSIS — M79609 Pain in unspecified limb: Secondary | ICD-10-CM | POA: Insufficient documentation

## 2012-03-14 LAB — CBC WITH DIFFERENTIAL/PLATELET
Basophils Absolute: 0 10*3/uL (ref 0.0–0.1)
Lymphocytes Relative: 41 % (ref 12–46)
Lymphs Abs: 2.3 10*3/uL (ref 0.7–4.0)
Neutro Abs: 2.9 10*3/uL (ref 1.7–7.7)
Platelets: 272 10*3/uL (ref 150–400)
RBC: 4.14 MIL/uL (ref 3.87–5.11)
RDW: 16.1 % — ABNORMAL HIGH (ref 11.5–15.5)
WBC: 5.7 10*3/uL (ref 4.0–10.5)

## 2012-03-14 LAB — URINE MICROSCOPIC-ADD ON

## 2012-03-14 LAB — COMPREHENSIVE METABOLIC PANEL
ALT: 10 U/L (ref 0–35)
Albumin: 3.6 g/dL (ref 3.5–5.2)
Alkaline Phosphatase: 73 U/L (ref 39–117)
Chloride: 102 mEq/L (ref 96–112)
GFR calc Af Amer: >90 mL/min (ref >90)
Glucose, Bld: 112 mg/dL — ABNORMAL HIGH (ref 70–99)
Potassium: 3.7 mEq/L (ref 3.5–5.1)
Sodium: 137 mEq/L (ref 135–145)
Total Protein: 7.3 g/dL (ref 6.0–8.3)

## 2012-03-14 LAB — URINALYSIS, ROUTINE W REFLEX MICROSCOPIC
Glucose, UA: NEGATIVE mg/dL
Leukocytes, UA: NEGATIVE
Protein, ur: NEGATIVE mg/dL
Specific Gravity, Urine: 1.03 (ref 1.005–1.030)
Urobilinogen, UA: 0.2 mg/dL (ref 0.0–1.0)

## 2012-03-14 NOTE — ED Notes (Signed)
Attempted to call pt x3 to move pt to room - no response.
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
## 2012-04-15 ENCOUNTER — Emergency Department (HOSPITAL_COMMUNITY)
Admission: EM | Admit: 2012-04-15 | Discharge: 2012-04-15 | Disposition: A | Discharge status: Home / Self Care | Payer: Self-pay | Attending: Emergency Medicine | Admitting: Emergency Medicine

## 2012-04-15 ENCOUNTER — Encounter (HOSPITAL_COMMUNITY): Payer: Self-pay | Admitting: Emergency Medicine

## 2012-04-15 DIAGNOSIS — H9209 Otalgia, unspecified ear: Secondary | ICD-10-CM | POA: Insufficient documentation

## 2012-04-15 DIAGNOSIS — H9203 Otalgia, bilateral: Secondary | ICD-10-CM

## 2012-04-15 DIAGNOSIS — D6800 Von Willebrand disease, unspecified: Secondary | ICD-10-CM | POA: Insufficient documentation

## 2012-04-15 HISTORY — DX: Migraine, unspecified, not intractable, without status migrainosus: G43.909

## 2012-04-15 MED ORDER — ANTIPYRINE-BENZOCAINE 5.4-1.4 % OT SOLN
3.0000 [drp] | Freq: Once | OTIC | Status: AC
  Administered 2012-04-15: 4 [drp] via OTIC
  Filled 2012-04-15: qty 10

## 2012-04-15 NOTE — ED Notes (Signed)
Ear pain in both ears x 3 days. Pain unresponsive to OTC meds

## 2012-04-15 NOTE — ED Provider Notes (Signed)
History     CSN: [id number]  Arrival date & time 04/15/12  1641   First MD Initiated Contact with Patient 04/15/12 1840      Chief Complaint  Patient presents with  . Otalgia    bilateral ear pain x 3 days    (Consider location/radiation/quality/duration/timing/severity/associated sxs/prior treatment) HPI  36 year old female presents complaining of ear pain. Patient reports gradual onset of bilateral ear pain. Describe pain as a throbbing sensation. States everything makes it worse including movement or rest.  Symptom has been persistent. She denies fever, headache, hearing changes, discharge, tinnitus, headache, cough, sore throat, runny nose, sneezing, coughing, or neck pain. She thought was a cold and was taking Mucinex without relief. She denies outer ear pain. She denies recent swimming pool or pond.  No hx of ear infection.  No recent medication changes.    Past Medical History  Diagnosis Date  . Von Willebrand disease   . Migraine     Past Surgical History  Procedure Date  . Cesarean section     Family History  Problem Relation Age of Onset  . Hypertension Other   . Diabetes Other   . Cancer Other     History  Substance Use Topics  . Smoking status: Never Smoker   . Smokeless tobacco: Not on file  . Alcohol Use: Yes     rarely    OB History    Grav Para Term Preterm Abortions TAB SAB Ect Mult Living                  Review of Systems  All other systems reviewed and are negative.    Allergies  Dilaudid and Penicillins  Home Medications   Current Outpatient Rx  Name Route Sig Dispense Refill  . GUAIFENESIN ER 600 MG PO TB12 Oral Take 1,200 mg by mouth 2 (two) times daily.      BP 135/86  Pulse 82  Temp 98.6 F (37 C) (Oral)  Resp 16  Wt 250 lb 8 oz (113.626 kg)  SpO2 98%  Physical Exam  Nursing note and vitals reviewed. Constitutional: She appears well-developed and well-nourished. No distress.  HENT:  Head: Normocephalic and  atraumatic.  Right Ear: Hearing, tympanic membrane, external ear and ear canal normal.  Left Ear: Hearing, tympanic membrane, external ear and ear canal normal.  Nose: Nose normal.  Mouth/Throat: Oropharynx is clear and moist. No oropharyngeal exudate.  Eyes: Conjunctivae and EOM are normal. Pupils are equal, round, and reactive to light.  Neck: Normal range of motion. Neck supple.  Musculoskeletal: Normal range of motion. She exhibits no edema and no tenderness.  Lymphadenopathy:    She has no cervical adenopathy.  Neurological: She is alert.  Skin: Skin is warm. No rash noted.  Psychiatric: She has a normal mood and affect.    ED Course  Procedures (including critical care time)  Labs Reviewed - No data to display No results found.   No diagnosis found.  1. otalgias  MDM  Bilateral ear pain without evidence of infection.  However will prescribe auralgan for comfort.  No other signs of infection.  No TMJ, No mastoiditis, no evidence of otitis externa.  Will refer to ENT as needed.  Pt voice understanding and agrees with plan.    BP 135/86  Pulse 82  Temp 98.6 F (37 C) (Oral)  Resp 16  Wt 250 lb 8 oz (113.626 kg)  SpO2 98%        Fayrene Helper,  PA-C 04/15/12 1914

## 2012-04-18 NOTE — ED Provider Notes (Signed)
Medical screening examination/treatment/procedure(s) were performed by non-physician practitioner and as supervising physician I was immediately available for consultation/collaboration.  Jones Skene, M.D.     Jones Skene, MD 04/18/12 1323

## 2014-01-07 ENCOUNTER — Encounter (HOSPITAL_BASED_OUTPATIENT_CLINIC_OR_DEPARTMENT_OTHER): Payer: Self-pay | Admitting: Emergency Medicine

## 2014-01-07 DIAGNOSIS — Y939 Activity, unspecified: Secondary | ICD-10-CM | POA: Insufficient documentation

## 2014-01-07 DIAGNOSIS — Z862 Personal history of diseases of the blood and blood-forming organs and certain disorders involving the immune mechanism: Secondary | ICD-10-CM | POA: Insufficient documentation

## 2014-01-07 DIAGNOSIS — Z8679 Personal history of other diseases of the circulatory system: Secondary | ICD-10-CM | POA: Insufficient documentation

## 2014-01-07 DIAGNOSIS — IMO0002 Reserved for concepts with insufficient information to code with codable children: Secondary | ICD-10-CM | POA: Insufficient documentation

## 2014-01-07 DIAGNOSIS — Z79899 Other long term (current) drug therapy: Secondary | ICD-10-CM | POA: Insufficient documentation

## 2014-01-07 DIAGNOSIS — Z880 Allergy status to penicillin: Secondary | ICD-10-CM | POA: Insufficient documentation

## 2014-01-07 DIAGNOSIS — Y929 Unspecified place or not applicable: Secondary | ICD-10-CM | POA: Insufficient documentation

## 2014-01-07 DIAGNOSIS — X58XXXA Exposure to other specified factors, initial encounter: Secondary | ICD-10-CM | POA: Insufficient documentation

## 2014-01-07 NOTE — ED Notes (Signed)
Pt complains of left upper thigh pain.  Denies injury.  Limping gait.  Reports severe pain when she bends her knee.

## 2014-01-08 ENCOUNTER — Emergency Department (HOSPITAL_BASED_OUTPATIENT_CLINIC_OR_DEPARTMENT_OTHER): Payer: BC Managed Care – PPO

## 2014-01-08 ENCOUNTER — Encounter (HOSPITAL_BASED_OUTPATIENT_CLINIC_OR_DEPARTMENT_OTHER): Payer: Self-pay | Admitting: Radiology

## 2014-01-08 ENCOUNTER — Emergency Department (HOSPITAL_BASED_OUTPATIENT_CLINIC_OR_DEPARTMENT_OTHER)
Admission: EM | Admit: 2014-01-08 | Discharge: 2014-01-08 | Disposition: A | Discharge status: Home / Self Care | Payer: BC Managed Care – PPO | Attending: Emergency Medicine | Admitting: Emergency Medicine

## 2014-01-08 DIAGNOSIS — T148XXA Other injury of unspecified body region, initial encounter: Secondary | ICD-10-CM

## 2014-01-08 MED ORDER — NAPROXEN 375 MG PO TABS: 375.0000 mg | ORAL_TABLET | Freq: Two times a day (BID) | ORAL | Status: DC

## 2014-01-08 MED ORDER — NAPROXEN 250 MG PO TABS
500.0000 mg | ORAL_TABLET | Freq: Once | ORAL | Status: AC
  Administered 2014-01-08: 500 mg via ORAL
  Filled 2014-01-08: qty 2
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
## 2014-01-08 NOTE — ED Notes (Signed)
Patient transported to Ultrasound 

## 2014-01-08 NOTE — Discharge Instructions (Signed)

## 2015-01-12 ENCOUNTER — Emergency Department (HOSPITAL_BASED_OUTPATIENT_CLINIC_OR_DEPARTMENT_OTHER)
Admission: EM | Admit: 2015-01-12 | Discharge: 2015-01-12 | Disposition: A | Discharge status: Home / Self Care | Payer: Managed Care, Other (non HMO) | Attending: Emergency Medicine | Admitting: Emergency Medicine

## 2015-01-12 ENCOUNTER — Encounter (HOSPITAL_BASED_OUTPATIENT_CLINIC_OR_DEPARTMENT_OTHER): Payer: Self-pay | Admitting: *Deleted

## 2015-01-12 DIAGNOSIS — E860 Dehydration: Secondary | ICD-10-CM | POA: Diagnosis not present

## 2015-01-12 DIAGNOSIS — Z862 Personal history of diseases of the blood and blood-forming organs and certain disorders involving the immune mechanism: Secondary | ICD-10-CM | POA: Diagnosis not present

## 2015-01-12 DIAGNOSIS — Z3202 Encounter for pregnancy test, result negative: Secondary | ICD-10-CM | POA: Diagnosis not present

## 2015-01-12 DIAGNOSIS — Z880 Allergy status to penicillin: Secondary | ICD-10-CM | POA: Insufficient documentation

## 2015-01-12 DIAGNOSIS — Z79899 Other long term (current) drug therapy: Secondary | ICD-10-CM | POA: Diagnosis not present

## 2015-01-12 DIAGNOSIS — Z8679 Personal history of other diseases of the circulatory system: Secondary | ICD-10-CM | POA: Diagnosis not present

## 2015-01-12 DIAGNOSIS — R112 Nausea with vomiting, unspecified: Secondary | ICD-10-CM

## 2015-01-12 DIAGNOSIS — R197 Diarrhea, unspecified: Secondary | ICD-10-CM

## 2015-01-12 DIAGNOSIS — Z791 Long term (current) use of non-steroidal anti-inflammatories (NSAID): Secondary | ICD-10-CM | POA: Insufficient documentation

## 2015-01-12 LAB — CBC
HEMATOCRIT: 35.6 % — AB (ref 36.0–46.0)
HEMOGLOBIN: 11.4 g/dL — AB (ref 12.0–15.0)
MCH: 25.1 pg — AB (ref 26.0–34.0)
MCHC: 32 g/dL (ref 30.0–36.0)
MCV: 78.2 fL (ref 78.0–100.0)
Platelets: 289 10*3/uL (ref 150–400)
RBC: 4.55 MIL/uL (ref 3.87–5.11)
RDW: 16.6 % — ABNORMAL HIGH (ref 11.5–15.5)
WBC: 4.4 10*3/uL (ref 4.0–10.5)

## 2015-01-12 LAB — BASIC METABOLIC PANEL
Anion gap: 11 (ref 5–15)
BUN: 11 mg/dL (ref 6–20)
CHLORIDE: 101 mmol/L (ref 101–111)
CO2: 27 mmol/L (ref 22–32)
CREATININE: 0.86 mg/dL (ref 0.44–1.00)
Calcium: 9.3 mg/dL (ref 8.9–10.3)
GFR calc non Af Amer: >60 mL/min (ref >60)
Glucose, Bld: 108 mg/dL — ABNORMAL HIGH (ref 65–99)
POTASSIUM: 3.4 mmol/L — AB (ref 3.5–5.1)
SODIUM: 139 mmol/L (ref 135–145)

## 2015-01-12 LAB — HCG, SERUM, QUALITATIVE: Preg, Serum: NEGATIVE

## 2015-01-12 MED ORDER — ONDANSETRON HCL 4 MG/2ML IJ SOLN
4.0000 mg | Freq: Once | INTRAMUSCULAR | Status: AC
  Administered 2015-01-12: 4 mg via INTRAVENOUS
  Filled 2015-01-12: qty 2

## 2015-01-12 MED ORDER — ONDANSETRON 8 MG PO TBDP: 8.0000 mg | ORAL_TABLET | Freq: Three times a day (TID) | ORAL | Status: DC | PRN

## 2015-01-12 MED ORDER — SODIUM CHLORIDE 0.9 % IV BOLUS (SEPSIS)
2000.0000 mL | Freq: Once | INTRAVENOUS | Status: AC
  Administered 2015-01-12: 08:00:00 via INTRAVENOUS

## 2015-01-12 MED ORDER — METOCLOPRAMIDE HCL 5 MG/ML IJ SOLN
10.0000 mg | Freq: Once | INTRAMUSCULAR | Status: AC
  Administered 2015-01-12: 10 mg via INTRAVENOUS
  Filled 2015-01-12: qty 2

## 2015-01-12 NOTE — Discharge Instructions (Signed)

## 2015-01-12 NOTE — ED Notes (Signed)
Pt amb to room 5 with quick steady gait, vomiting while being triaged. Pt reports diarrhea x Friday, last episode at 2am, "it's like water...". Vomiting x Saturday. Unable to retain fluids or solids. Denies any abd pain, "it's just sore from me throwing up.Marland KitchenMarland Kitchen"

## 2015-01-12 NOTE — ED Provider Notes (Signed)
CSN: [id number]     Arrival date & time 01/12/15  0701 History   First MD Initiated Contact with Patient 01/12/15 0703     Chief Complaint  Patient presents with  . Emesis      HPI Patient ports nausea vomiting diarrhea over the weekend.  This is now been going on for 2-3 days.  Last episode of diarrhea was around 2 AM.  She continues to vomit.  She denies abdominal discomfort.  She reports she is "sore" from vomiting.  No fevers or chills.  No recent sick contacts.  Unable to keep fluids down.  Symptoms are moderate to severe in severity.  Denies hematemesis.  No melena or hematochezia described.   Past Medical History  Diagnosis Date  . Von Willebrand disease   . Migraine    Past Surgical History  Procedure Laterality Date  . Cesarean section    . Cholecystectomy     Family History  Problem Relation Age of Onset  . Hypertension Other   . Diabetes Other   . Cancer Other    History  Substance Use Topics  . Smoking status: Never Smoker   . Smokeless tobacco: Not on file  . Alcohol Use: Yes     Comment: rarely   OB History    No data available     Review of Systems  All other systems reviewed and are negative.     Allergies  Dilaudid and Penicillins  Home Medications   Prior to Admission medications   Medication Sig Start Date End Date Taking? Authorizing Provider  guaiFENesin (MUCINEX) 600 MG 12 hr tablet Take 1,200 mg by mouth 2 (two) times daily.    Historical Provider, MD  naproxen (NAPROSYN) 375 MG tablet Take 1 tablet (375 mg total) by mouth 2 (two) times daily. 01/08/14   April Palumbo, MD   BP 156/84 mmHg  Pulse 99  Temp(Src) 98.3 F (36.8 C) (Oral)  Resp 18  Ht 5\' 9"  (1.753 m)  Wt 263 lb (119.296 kg)  BMI 38.82 kg/m2  SpO2 100%  LMP 01/01/2015 Physical Exam  Constitutional: She is oriented to person, place, and time. She appears well-developed and well-nourished. No distress.  HENT:  Head: Normocephalic and atraumatic.  Dry mucous membranes   Eyes: EOM are normal.  Neck: Normal range of motion.  Cardiovascular: Normal rate, regular rhythm and normal heart sounds.   Pulmonary/Chest: Effort normal and breath sounds normal.  Abdominal: Soft. She exhibits no distension. There is no tenderness.  Musculoskeletal: Normal range of motion.  Neurological: She is alert and oriented to person, place, and time.  Skin: Skin is warm and dry.  Psychiatric: She has a normal mood and affect. Judgment normal.  Nursing note and vitals reviewed.   ED Course  Procedures (including critical care time) Labs Review Labs Reviewed  BASIC METABOLIC PANEL - Abnormal; Notable for the following:    Potassium 3.4 (*)    Glucose, Bld 108 (*)    All other components within normal limits  CBC - Abnormal; Notable for the following:    Hemoglobin 11.4 (*)    HCT 35.6 (*)    MCH 25.1 (*)    RDW 16.6 (*)    All other components within normal limits  HCG, SERUM, QUALITATIVE    Imaging Review No results found.   EKG Interpretation None      MDM   Final diagnoses:  None    Symptomatic control.  Abdominal exam is benign.  Likely viral  process.  We'll hydrate and emergency department.  9:27 AM Feels much better at this time.  Nausea control.  Hydrated.  Feeling better.  Discharge home in good condition.  Repeat abdominal exam is benign.   Jola Schmidt, MD 01/12/15 [phone]

## 2015-01-12 NOTE — ED Notes (Signed)
MD at bedside. 

## 2015-11-06 ENCOUNTER — Encounter (HOSPITAL_BASED_OUTPATIENT_CLINIC_OR_DEPARTMENT_OTHER): Payer: Self-pay | Admitting: Emergency Medicine

## 2015-11-06 ENCOUNTER — Emergency Department (HOSPITAL_BASED_OUTPATIENT_CLINIC_OR_DEPARTMENT_OTHER)
Admission: EM | Admit: 2015-11-06 | Discharge: 2015-11-06 | Disposition: A | Discharge status: Home / Self Care | Payer: Managed Care, Other (non HMO) | Attending: Emergency Medicine | Admitting: Emergency Medicine

## 2015-11-06 DIAGNOSIS — Z880 Allergy status to penicillin: Secondary | ICD-10-CM | POA: Insufficient documentation

## 2015-11-06 DIAGNOSIS — R109 Unspecified abdominal pain: Secondary | ICD-10-CM

## 2015-11-06 DIAGNOSIS — Z3202 Encounter for pregnancy test, result negative: Secondary | ICD-10-CM | POA: Diagnosis not present

## 2015-11-06 DIAGNOSIS — Z9889 Other specified postprocedural states: Secondary | ICD-10-CM | POA: Insufficient documentation

## 2015-11-06 DIAGNOSIS — Z791 Long term (current) use of non-steroidal anti-inflammatories (NSAID): Secondary | ICD-10-CM | POA: Insufficient documentation

## 2015-11-06 DIAGNOSIS — Z9049 Acquired absence of other specified parts of digestive tract: Secondary | ICD-10-CM | POA: Insufficient documentation

## 2015-11-06 DIAGNOSIS — Z8679 Personal history of other diseases of the circulatory system: Secondary | ICD-10-CM | POA: Insufficient documentation

## 2015-11-06 DIAGNOSIS — Z862 Personal history of diseases of the blood and blood-forming organs and certain disorders involving the immune mechanism: Secondary | ICD-10-CM | POA: Insufficient documentation

## 2015-11-06 LAB — URINALYSIS, ROUTINE W REFLEX MICROSCOPIC
BILIRUBIN URINE: NEGATIVE
Glucose, UA: NEGATIVE mg/dL
Hgb urine dipstick: NEGATIVE
Ketones, ur: NEGATIVE mg/dL
LEUKOCYTES UA: NEGATIVE
NITRITE: NEGATIVE
PH: 6 (ref 5.0–8.0)
Protein, ur: NEGATIVE mg/dL
SPECIFIC GRAVITY, URINE: 1.024 (ref 1.005–1.030)

## 2015-11-06 LAB — PREGNANCY, URINE: Preg Test, Ur: NEGATIVE

## 2015-11-06 MED ORDER — CYCLOBENZAPRINE HCL 10 MG PO TABS: 10.0000 mg | ORAL_TABLET | Freq: Three times a day (TID) | ORAL | Status: DC | PRN

## 2015-11-06 MED ORDER — CYCLOBENZAPRINE HCL 10 MG PO TABS
5.0000 mg | ORAL_TABLET | Freq: Once | ORAL | Status: AC
  Administered 2015-11-06: 5 mg via ORAL
  Filled 2015-11-06: qty 1

## 2015-11-06 NOTE — ED Provider Notes (Signed)
CSN: [id number]     Arrival date & time 11/06/15  [id number] History   First MD Initiated Contact with Patient 11/06/15 [phone]     Chief Complaint  Patient presents with  . Abdominal Pain   (Consider location/radiation/quality/duration/timing/severity/associated sxs/prior Treatment) HPI 40 y.o. female presents to the Emergency Department today complaining of right flank pain since Tuesday. Notes pain is worse with movement. Notes 10/10 when twisting and like a sharp pain. Intermittent. When bouts of pain go away, she noted a dull ache around 5/10. States that it feels like a muscle spasm. Has tried OTC tylenol with minimal relief. No hx kidney stones. Does not endorse any trauma. No fevers. No CP/SOB/ABD pain. No N/V/D. No loss of bowel or bladder function. No other symptoms noted.   Past Medical History  Diagnosis Date  . Von Willebrand disease ([place])   . Migraine    Past Surgical History  Procedure Laterality Date  . Cesarean section    . Cholecystectomy     Family History  Problem Relation Age of Onset  . Hypertension Other   . Diabetes Other   . Cancer Other    Social History  Substance Use Topics  . Smoking status: Never Smoker   . Smokeless tobacco: None  . Alcohol Use: Yes     Comment: rarely   OB History    No data available     Review of Systems ROS reviewed and all are negative for acute change except as noted in the HPI.  Allergies  Dilaudid and Penicillins  Home Medications   Prior to Admission medications   Medication Sig Start Date End Date Taking? Authorizing Provider  naproxen (NAPROSYN) 375 MG tablet Take 1 tablet (375 mg total) by mouth 2 (two) times daily. 01/08/14   April Palumbo, MD  ondansetron (ZOFRAN ODT) 8 MG disintegrating tablet Take 1 tablet (8 mg total) by mouth every 8 (eight) hours as needed for nausea or vomiting. 01/12/15   Jola Schmidt, MD   BP 132/80 mmHg  Temp(Src) 98.2 F (36.8 C) (Oral)  Resp 18  Ht 5\' 9"  (1.753 m)  Wt 122.471 kg   BMI 39.85 kg/m2  SpO2 100%  LMP 10/16/2015 Physical Exam  Constitutional: She is oriented to person, place, and time. She appears well-developed and well-nourished.  HENT:  Head: Normocephalic and atraumatic.  Eyes: EOM are normal. Pupils are equal, round, and reactive to light.  Neck: Normal range of motion. Neck supple. No tracheal deviation present.  Cardiovascular: Normal rate, regular rhythm and normal heart sounds.   No murmur heard. Pulmonary/Chest: Effort normal and breath sounds normal. No respiratory distress. She has no wheezes. She has no rales. She exhibits no tenderness.  Abdominal: Soft. Normal appearance and bowel sounds are normal. There is no tenderness. There is no rigidity, no guarding, no CVA tenderness, no tenderness at McBurney's point and negative Murphy's sign.  Musculoskeletal: Normal range of motion.       Cervical back: Normal.       Thoracic back: Normal.       Lumbar back: Normal.  TTP Right Flank. Worse with twisting motion. No TTP of spinous process. No deformities/step offs noted. Pt able to ambulate without difficulty   Neurological: She is alert and oriented to person, place, and time.  Skin: Skin is warm and dry.  Psychiatric: She has a normal mood and affect. Her behavior is normal. Thought content normal.  Nursing note and vitals reviewed.   ED Course  Procedures (including  critical care time) Labs Review Labs Reviewed  PREGNANCY, URINE  URINALYSIS, ROUTINE W REFLEX MICROSCOPIC (NOT AT [facility])   Imaging Review No results found. I have personally reviewed and evaluated these images and lab results as part of my medical decision-making.   EKG Interpretation None      MDM  I have reviewed and evaluated the relevant laboratory values.I have reviewed the relevant previous healthcare records.I obtained HPI from historian.  ED Course:  Assessment: Pt is a 39yF who presents with right flank pain since Tuesday. Notes no trauma. States she felt  in bed when twisting. No fevers. No dysuria. On exam, pt in NAD. Nontoxic/nonseptic appearing. VSS. Afebrile. Lungs CTA. Heart RRR. Abdomen nontender soft. TTP right flank. No spinous process tenderess. Neg McBurney. Pt able to ambulate without difficulty. Pain mostly flank and not abdomen. UA shows no infection/hemaglobin. Most likely muscle strain based on HPI and presentation. Given Flexeril in ED. Plan is to DC home with muscle relaxant. At time of discharge, Patient is in no acute distress. Vital Signs are stable. Patient is able to ambulate. Patient able to tolerate PO.    Disposition/Plan:  DC Home Additional Verbal discharge instructions given and discussed with patient.  Pt Instructed to f/u with PCP in the next 48 hours for evaluation and treatment of symptoms. Return precautions given Pt acknowledges and agrees with plan  Supervising Physician Davonna Belling, MD   Final diagnoses:  Flank pain        Shary Decamp, PA-C 11/06/15 [place], MD 11/06/15 1414

## 2015-11-06 NOTE — ED Notes (Signed)
PA at bedside.

## 2015-11-06 NOTE — Discharge Instructions (Signed)
Please read and follow all provided instructions.  Your diagnoses today include:  1. Flank pain    Tests performed today include:  Vital signs. See below for your results today.   Medications prescribed:   Take medication as prescribed   Home care instructions:  Follow any educational materials contained in this packet.  Follow-up instructions: Please follow-up with your primary care provider in the next 48-72 hours for further evaluation of symptoms and treatment   Return instructions:   Please return to the Emergency Department if you do not get better, if you get worse, or new symptoms OR  - Fever (temperature greater than 101.34F)  - Bleeding that does not stop with holding pressure to the area    -Severe pain (please note that you may be more sore the day after your accident)  - Chest Pain  - Difficulty breathing  - Severe nausea or vomiting  - Inability to tolerate food and liquids  - Passing out  - Skin becoming red around your wounds  - Change in mental status (confusion or lethargy)  - New numbness or weakness     Please return if you have any other emergent concerns.  Additional Information:  Your vital signs today were: BP 132/80 mmHg   Temp(Src) 98.2 F (36.8 C) (Oral)   Resp 18   Ht 5\' 9"  (1.753 m)   Wt 122.471 kg   BMI 39.85 kg/m2   SpO2 100%   LMP 10/16/2015 If your blood pressure (BP) was elevated above 135/85 this visit, please have this repeated by your doctor within one month. ---------------

## 2015-11-06 NOTE — ED Notes (Signed)
Right side, abdominal pain since Tuesday, radiating to back.  No dysuria, no vaginal discharge, no injury.

## 2017-07-07 ENCOUNTER — Other Ambulatory Visit: Payer: Self-pay | Admitting: Obstetrics and Gynecology

## 2017-07-07 DIAGNOSIS — Z1231 Encounter for screening mammogram for malignant neoplasm of breast: Secondary | ICD-10-CM

## 2017-08-10 ENCOUNTER — Other Ambulatory Visit: Payer: Self-pay | Admitting: Obstetrics and Gynecology

## 2017-08-10 ENCOUNTER — Ambulatory Visit
Admission: RE | Admit: 2017-08-10 | Discharge: 2017-08-10 | Disposition: A | Discharge status: Home / Self Care | Payer: Managed Care, Other (non HMO) | Source: Ambulatory Visit | Attending: Obstetrics and Gynecology | Admitting: Obstetrics and Gynecology

## 2017-08-10 DIAGNOSIS — Z1231 Encounter for screening mammogram for malignant neoplasm of breast: Secondary | ICD-10-CM

## 2017-10-04 ENCOUNTER — Encounter (HOSPITAL_COMMUNITY)
Admission: RE | Admit: 2017-10-04 | Discharge: 2017-10-04 | Disposition: A | Discharge status: Home / Self Care | Payer: 59 | Source: Ambulatory Visit | Attending: Internal Medicine | Admitting: Internal Medicine

## 2017-10-04 ENCOUNTER — Other Ambulatory Visit (HOSPITAL_COMMUNITY): Payer: Self-pay | Admitting: Internal Medicine

## 2017-10-04 DIAGNOSIS — C73 Malignant neoplasm of thyroid gland: Secondary | ICD-10-CM | POA: Insufficient documentation

## 2017-10-04 LAB — HCG, SERUM, QUALITATIVE: PREG SERUM: NEGATIVE

## 2017-10-04 MED ORDER — SODIUM IODIDE I 131 CAPSULE
75.0000 | Freq: Once | INTRAVENOUS | Status: AC | PRN
  Administered 2017-10-04: 75 via ORAL

## 2017-10-05 ENCOUNTER — Other Ambulatory Visit (HOSPITAL_COMMUNITY): Payer: Self-pay | Admitting: Internal Medicine

## 2017-10-05 DIAGNOSIS — C73 Malignant neoplasm of thyroid gland: Secondary | ICD-10-CM

## 2017-10-13 ENCOUNTER — Encounter (HOSPITAL_COMMUNITY)
Admission: RE | Admit: 2017-10-13 | Discharge: 2017-10-13 | Disposition: A | Discharge status: Home / Self Care | Payer: 59 | Source: Ambulatory Visit | Attending: Internal Medicine | Admitting: Internal Medicine

## 2017-10-13 DIAGNOSIS — C73 Malignant neoplasm of thyroid gland: Secondary | ICD-10-CM | POA: Diagnosis not present

## 2018-01-23 ENCOUNTER — Other Ambulatory Visit: Payer: Self-pay

## 2018-01-23 ENCOUNTER — Emergency Department (HOSPITAL_BASED_OUTPATIENT_CLINIC_OR_DEPARTMENT_OTHER)
Admission: EM | Admit: 2018-01-23 | Discharge: 2018-01-24 | Disposition: A | Discharge status: Home / Self Care | Payer: 59 | Attending: Emergency Medicine | Admitting: Emergency Medicine

## 2018-01-23 ENCOUNTER — Encounter (HOSPITAL_BASED_OUTPATIENT_CLINIC_OR_DEPARTMENT_OTHER): Payer: Self-pay | Admitting: Adult Health

## 2018-01-23 DIAGNOSIS — Z79899 Other long term (current) drug therapy: Secondary | ICD-10-CM | POA: Diagnosis not present

## 2018-01-23 DIAGNOSIS — E039 Hypothyroidism, unspecified: Secondary | ICD-10-CM | POA: Insufficient documentation

## 2018-01-23 DIAGNOSIS — L282 Other prurigo: Secondary | ICD-10-CM | POA: Insufficient documentation

## 2018-01-23 DIAGNOSIS — R21 Rash and other nonspecific skin eruption: Secondary | ICD-10-CM | POA: Diagnosis present

## 2018-01-23 HISTORY — DX: Malignant neoplasm of thyroid gland: C73

## 2018-01-23 HISTORY — DX: Hypothyroidism, unspecified: E03.9

## 2018-01-23 NOTE — ED Triage Notes (Signed)
Presents with rash to left arm that has been there 2 weeks and itches, she also developed a rash on her chest and neck that began yesterday that also itches. SHe denies any changes in her detergents or contact with any known allergens.

## 2018-01-24 MED ORDER — HYDROXYZINE HCL 25 MG PO TABS: 25.0000 mg | ORAL_TABLET | ORAL | 0 refills | Status: AC | PRN

## 2018-01-24 MED ORDER — HYDROXYZINE HCL 25 MG PO TABS
25.0000 mg | ORAL_TABLET | Freq: Once | ORAL | Status: AC
  Administered 2018-01-24: 25 mg via ORAL
  Filled 2018-01-24: qty 1

## 2018-01-24 MED ORDER — HYDROCORTISONE 1 % EX CREA
TOPICAL_CREAM | Freq: Two times a day (BID) | CUTANEOUS | Status: DC
  Administered 2018-01-24: 03:00:00 via TOPICAL
  Filled 2018-01-24: qty 28

## 2018-01-24 NOTE — ED Provider Notes (Signed)
[facility] Provider Note: Georgena Spurling, MD, FACEP  CSN: [id number] MRN: [id number] ARRIVAL: 01/23/18 at [place]: [place]  01/24/18 2:52 AM Kristen Boyd is a 42 y.o. female who has had itching for about the past 2 weeks beginning in her left arm.  She has been applying calamine lotion without relief.  She does not know what may have triggered this.  She has not taken any antihistamines.  Yesterday she developed an itchy, urticarial rash around her neck and upper chest.  This is also pruritic.  She denies shortness of breath, wheezing or throat swelling.  She denies GI symptoms.  Symptoms are moderate.   Past Medical History:  Diagnosis Date  . Hypothyroidism   . Migraine   . Thyroid cancer ([place])   . Von Willebrand disease ([place])     Past Surgical History:  Procedure Laterality Date  . CESAREAN SECTION    . CHOLECYSTECTOMY    . THYROIDECTOMY      Family History  Problem Relation Age of Onset  . Hypertension Other   . Diabetes Other   . Cancer Other   . Breast cancer Neg Hx     Social History   Tobacco Use  . Smoking status: Never Smoker  Substance Use Topics  . Alcohol use: Yes    Comment: rarely  . Drug use: No    Prior to Admission medications   Medication Sig Start Date End Date Taking? Authorizing Provider  levothyroxine (SYNTHROID) 112 MCG tablet Take by mouth. 12/05/17  Yes [provider]  cyclobenzaprine (FLEXERIL) 10 MG tablet Take 1 tablet (10 mg total) by mouth 3 (three) times daily as needed for muscle spasms. 11/06/15   Shary Decamp, PA-C  naproxen (NAPROSYN) 375 MG tablet Take 1 tablet (375 mg total) by mouth 2 (two) times daily. 01/08/14   Palumbo, April, MD  ondansetron (ZOFRAN ODT) 8 MG disintegrating tablet Take 1 tablet (8 mg total) by mouth every 8 (eight) hours as needed for nausea or vomiting. 01/12/15   Jola Schmidt, MD    Allergies Dilaudid [hydromorphone hcl]  and Penicillins   REVIEW OF SYSTEMS  Negative except as noted here or in the History of Present Illness.   PHYSICAL EXAMINATION  Initial Vital Signs Blood pressure (!) 123/48, pulse 94, temperature 98.3 F (36.8 C), temperature source Oral, resp. rate 18, SpO2 100 %.  Examination General: Well-developed, well-nourished female in no acute distress; appearance consistent with age of record HENT: normocephalic; atraumatic; normal voice Eyes: Normal appearance Neck: supple Heart: regular rate and rhythm Lungs: clear to auscultation bilaterally Abdomen: soft; nondistended; nontender; bowel sounds present Extremities: No deformity; full range of motion; pulses normal Neurologic: Awake, alert and oriented; motor function intact in all extremities and symmetric; no facial droop Skin: Warm and dry; sparse urticarial rash of neck and upper chest; no significant rash seen on arms Psychiatric: Normal mood and affect   RESULTS  Summary of this visit's results, reviewed by myself:   EKG Interpretation  Date/Time:    Ventricular Rate:    PR Interval:    QRS Duration:   QT Interval:    QTC Calculation:   R Axis:     Text Interpretation:        Laboratory Studies: No results found for this or any previous visit (from the past 24 hour(s)). Imaging Studies: No results found.  ED COURSE and MDM  Nursing notes  and initial vitals signs, including pulse oximetry, reviewed.  Vitals:   01/23/18 2300  BP: (!) 123/48  Pulse: 94  Resp: 18  Temp: 98.3 F (36.8 C)  TempSrc: Oral  SpO2: 100%   Because of the patient's itching and rash are unclear as she denies new detergents or other environmental changes.  We will treat her with hydroxyzine for itching and hydrocodone topically.  PROCEDURES    ED DIAGNOSES     ICD-10-CM   1. Pruritic rash L28.2        Hairo Garraway, Jenny Reichmann, MD 01/24/18 [phone]

## 2018-07-23 ENCOUNTER — Other Ambulatory Visit (HOSPITAL_COMMUNITY): Payer: Self-pay | Admitting: Internal Medicine

## 2018-07-23 DIAGNOSIS — C73 Malignant neoplasm of thyroid gland: Secondary | ICD-10-CM

## 2018-07-25 ENCOUNTER — Ambulatory Visit (HOSPITAL_COMMUNITY): Admission: RE | Admit: 2018-07-25 | Payer: 59 | Source: Ambulatory Visit

## 2018-07-25 ENCOUNTER — Other Ambulatory Visit (HOSPITAL_COMMUNITY): Payer: Self-pay | Admitting: Internal Medicine

## 2018-07-25 DIAGNOSIS — C73 Malignant neoplasm of thyroid gland: Secondary | ICD-10-CM

## 2018-08-06 ENCOUNTER — Encounter (HOSPITAL_COMMUNITY)
Admission: RE | Admit: 2018-08-06 | Discharge: 2018-08-06 | Disposition: A | Discharge status: Home / Self Care | Payer: 59 | Source: Ambulatory Visit | Attending: Internal Medicine | Admitting: Internal Medicine

## 2018-08-06 DIAGNOSIS — C73 Malignant neoplasm of thyroid gland: Secondary | ICD-10-CM | POA: Diagnosis not present

## 2018-08-06 MED ORDER — STERILE WATER FOR INJECTION IJ SOLN
INTRAMUSCULAR | Status: AC
  Administered 2018-08-06: 1.2 mL
  Filled 2018-08-06: qty 10

## 2018-08-06 MED ORDER — THYROTROPIN ALFA 1.1 MG IM SOLR
0.9000 mg | INTRAMUSCULAR | Status: AC
  Administered 2018-08-06: 0.9 mg via INTRAMUSCULAR

## 2018-08-07 ENCOUNTER — Ambulatory Visit (HOSPITAL_COMMUNITY)
Admission: RE | Admit: 2018-08-07 | Discharge: 2018-08-07 | Disposition: A | Discharge status: Home / Self Care | Payer: 59 | Source: Ambulatory Visit | Attending: Internal Medicine | Admitting: Internal Medicine

## 2018-08-07 DIAGNOSIS — C73 Malignant neoplasm of thyroid gland: Secondary | ICD-10-CM | POA: Insufficient documentation

## 2018-08-07 MED ORDER — THYROTROPIN ALFA 1.1 MG IM SOLR
0.9000 mg | INTRAMUSCULAR | Status: AC
  Administered 2018-08-07: 0.9 mg via INTRAMUSCULAR

## 2018-08-07 MED ORDER — STERILE WATER FOR INJECTION IJ SOLN
INTRAMUSCULAR | Status: AC
  Filled 2018-08-07: qty 10

## 2018-08-08 ENCOUNTER — Ambulatory Visit (HOSPITAL_COMMUNITY)
Admission: RE | Admit: 2018-08-08 | Discharge: 2018-08-08 | Disposition: A | Discharge status: Home / Self Care | Payer: 59 | Source: Ambulatory Visit | Attending: Internal Medicine | Admitting: Internal Medicine

## 2018-08-08 DIAGNOSIS — C73 Malignant neoplasm of thyroid gland: Secondary | ICD-10-CM | POA: Diagnosis not present

## 2018-08-08 MED ORDER — SODIUM IODIDE I 131 CAPSULE
4.0000 | Freq: Once | INTRAVENOUS | Status: AC | PRN
  Administered 2018-08-08: 4 via ORAL

## 2018-08-10 ENCOUNTER — Ambulatory Visit (HOSPITAL_COMMUNITY)
Admission: RE | Admit: 2018-08-10 | Discharge: 2018-08-10 | Disposition: A | Discharge status: Home / Self Care | Payer: 59 | Source: Ambulatory Visit | Attending: Internal Medicine | Admitting: Internal Medicine

## 2018-08-10 DIAGNOSIS — C73 Malignant neoplasm of thyroid gland: Secondary | ICD-10-CM | POA: Insufficient documentation

## 2018-08-10 MED ORDER — SODIUM IODIDE I 131 CAPSULE
4.0000 | Freq: Once | INTRAVENOUS | Status: AC | PRN
  Administered 2018-08-10: 4 via ORAL

## 2018-08-11 LAB — THYROGLOBULIN ANTIBODY: Thyroglobulin Antibody: <1 IU/mL (ref 0.0–0.9)

## 2018-08-17 LAB — THYROGLOBULIN LEVEL: Thyroglobulin: <2 ng/mL

## 2018-09-27 ENCOUNTER — Emergency Department (HOSPITAL_BASED_OUTPATIENT_CLINIC_OR_DEPARTMENT_OTHER): Payer: 59

## 2018-09-27 ENCOUNTER — Emergency Department (HOSPITAL_BASED_OUTPATIENT_CLINIC_OR_DEPARTMENT_OTHER)
Admission: EM | Admit: 2018-09-27 | Discharge: 2018-09-27 | Disposition: A | Discharge status: Home / Self Care | Payer: 59 | Attending: Emergency Medicine | Admitting: Emergency Medicine

## 2018-09-27 ENCOUNTER — Encounter (HOSPITAL_BASED_OUTPATIENT_CLINIC_OR_DEPARTMENT_OTHER): Payer: Self-pay | Admitting: *Deleted

## 2018-09-27 ENCOUNTER — Other Ambulatory Visit: Payer: Self-pay

## 2018-09-27 DIAGNOSIS — J111 Influenza due to unidentified influenza virus with other respiratory manifestations: Secondary | ICD-10-CM | POA: Insufficient documentation

## 2018-09-27 DIAGNOSIS — Z9049 Acquired absence of other specified parts of digestive tract: Secondary | ICD-10-CM | POA: Diagnosis not present

## 2018-09-27 DIAGNOSIS — E039 Hypothyroidism, unspecified: Secondary | ICD-10-CM | POA: Diagnosis not present

## 2018-09-27 DIAGNOSIS — Z85850 Personal history of malignant neoplasm of thyroid: Secondary | ICD-10-CM | POA: Insufficient documentation

## 2018-09-27 DIAGNOSIS — D680 Von Willebrand's disease: Secondary | ICD-10-CM | POA: Diagnosis not present

## 2018-09-27 DIAGNOSIS — R05 Cough: Secondary | ICD-10-CM | POA: Diagnosis present

## 2018-09-27 DIAGNOSIS — R69 Illness, unspecified: Secondary | ICD-10-CM

## 2018-09-27 MED ORDER — ALBUTEROL SULFATE HFA 108 (90 BASE) MCG/ACT IN AERS: 1.0000 | INHALATION_SPRAY | Freq: Four times a day (QID) | RESPIRATORY_TRACT | 0 refills | Status: AC | PRN

## 2018-09-27 MED ORDER — DEXAMETHASONE SODIUM PHOSPHATE 10 MG/ML IJ SOLN
10.0000 mg | Freq: Once | INTRAMUSCULAR | Status: AC
  Administered 2018-09-27: 10 mg via INTRAMUSCULAR
  Filled 2018-09-27: qty 1

## 2018-09-27 MED ORDER — FLUTICASONE PROPIONATE 50 MCG/ACT NA SUSP: 2.0000 | Freq: Every day | NASAL | 0 refills | Status: AC

## 2018-09-27 MED ORDER — BENZONATATE 100 MG PO CAPS: 100.0000 mg | ORAL_CAPSULE | Freq: Three times a day (TID) | ORAL | 0 refills | Status: AC | PRN

## 2018-09-27 MED FILL — ALBUTEROL SULFATE HFA 108 (: 108 (90 BAS | 25 days supply | Qty: 18 | Fill #0

## 2018-09-27 MED FILL — FLUTICASONE PROP 50 MCG SPR: 50 | 30 days supply | Qty: 16 | Fill #0

## 2018-09-27 MED FILL — BENZONATATE 100 MG CAP: 100 | 7 days supply | Qty: 21 | Fill #0

## 2018-09-27 NOTE — ED Provider Notes (Signed)
Emergency Department Provider Note   I have reviewed the triage vital signs and the nursing notes.   HISTORY  Chief Complaint Cough   HPI TEAH VOTAW is a 43 y.o. female with PMH of Hypothyroidism and Von Willebrand disease presents to the emergency department for evaluation of cough, congestion.  Symptoms began 2.5 days ago.  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  She denies any vomiting or diarrhea.  She has had some chest discomfort but only with coughing.  Her cough is productive of yellow sputum.  No hemoptysis.  She denies noticing any fever, body aches, or chills.  No back pain or UTI symptoms.  No radiation of symptoms or other modifying factors.  She has been taking Mucinex over-the-counter with little relief. No tobacco exposure at work or home.    Past Medical History:  Diagnosis Date  . Hypothyroidism   . Migraine   . Thyroid cancer ([place])   . Von Willebrand disease ([place])     There are no active problems to display for this patient.   Past Surgical History:  Procedure Laterality Date  . CESAREAN SECTION    . CHOLECYSTECTOMY    . THYROIDECTOMY     Allergies Dilaudid [hydromorphone hcl] and Penicillins  Family History  Problem Relation Age of Onset  . Hypertension Other   . Diabetes Other   . Cancer Other   . Breast cancer Neg Hx     Social History Social History   Tobacco Use  . Smoking status: Never Smoker  . Smokeless tobacco: Never Used  Substance Use Topics  . Alcohol use: Not Currently    Comment: rarely  . Drug use: No    Review of Systems  Constitutional: No fever/chills Eyes: No visual changes. ENT: Mild sore throat. Positive congestion.  Cardiovascular: Denies chest pain. Respiratory: Denies shortness of breath. Positive productive cough.  Gastrointestinal: No abdominal pain.  No nausea, no vomiting.  No diarrhea.  No constipation. Genitourinary: Negative for  dysuria. Musculoskeletal: Negative for back pain. Skin: Negative for rash. Neurological: Negative for headaches, focal weakness or numbness.  10-point ROS otherwise negative.  ____________________________________________   PHYSICAL EXAM:  VITAL SIGNS: ED Triage Vitals  Enc Vitals Group     BP 09/27/18 1254 125/74     Pulse Rate 09/27/18 1254 87     Resp 09/27/18 1254 20     Temp 09/27/18 1254 98.2 F (36.8 C)     Temp Source 09/27/18 1254 Oral     SpO2 09/27/18 1254 100 %     Weight 09/27/18 1253 276 lb (125.2 kg)     Height 09/27/18 1253 5\' 9"  (1.753 m)     Pain Score 09/27/18 1253 3   Constitutional: Alert and oriented. Well appearing and in no acute distress. Frequent cough noted.  Eyes: Conjunctivae are normal.  Head: Atraumatic. Nose: Mild congestion/rhinnorhea. Mouth/Throat: Mucous membranes are moist.  Oropharynx with mild erythema. No exudate or PTA. Widely patent oropharynx.  Neck: No stridor.  Cardiovascular: Normal rate, regular rhythm. Good peripheral circulation. Grossly normal heart sounds.   Respiratory: Normal respiratory effort.  No retractions. Lungs CTAB. Gastrointestinal: Soft and nontender. No distention.  Musculoskeletal: No lower extremity tenderness nor edema. No gross deformities of extremities. Neurologic:  Normal speech and language. No gross focal neurologic deficits are appreciated.  Skin:  Skin is warm, dry and intact. No rash noted.  ____________________________________________  RADIOLOGY  Dg Chest  2 View  Result Date: 09/27/2018 CLINICAL DATA:  Cough since Tuesday. EXAM: CHEST - 2 VIEW COMPARISON:  Chest x-ray dated 01/29/2017. FINDINGS: The heart size and mediastinal contours are within normal limits. Both lungs are clear. The visualized skeletal structures are unremarkable. IMPRESSION: No active cardiopulmonary disease.  No evidence of pneumonia. Electronically Signed   By: Franki Cabot M.D.   On: 09/27/2018 13:21     ____________________________________________   PROCEDURES  Procedure(s) performed:   Procedures  None ____________________________________________   INITIAL IMPRESSION / ASSESSMENT AND PLAN / ED COURSE  Pertinent labs & imaging results that were available during my care of the patient were reviewed by me and considered in my medical decision making (see chart for details).  Patient with cough, congestion.  No hypoxemia or increased work of breathing.  No significant wheezing on exam.  Patient has chest pain only with coughing.  My suspicion for acute coronary syndrome and/or PE is extremely low.  Symptoms seem most consistent with bronchitis.  I do not feel the patient would benefit from lab testing or EKG.  I will obtain a chest x-ray given the productive nature of cough.  Considered flu and discussed this with patient.  She is going on 3 days of symptoms.  She is unlikely to benefit significantly from Tamiflu and is not otherwise high risk for flu complications.  Discussed the risk/benefits and patient not interested in taking this medication.  No flu testing at this time.  Plan for chest x-ray to rule out infiltrate and reassess.   01:30 PM Chest x-ray with no infiltrate.  Discussed expectant management at home and prescribed medication for symptom relief.  Patient provided a work note.  Discussed ED return precautions in detail. ____________________________________________  FINAL CLINICAL IMPRESSION(S) / ED DIAGNOSES  Final diagnoses:  Influenza-like illness     MEDICATIONS GIVEN DURING THIS VISIT:  Medications  dexamethasone (DECADRON) injection 10 mg (10 mg Intramuscular Given 09/27/18 1314)     NEW OUTPATIENT MEDICATIONS STARTED DURING THIS VISIT:  New Prescriptions   ALBUTEROL (PROVENTIL HFA;VENTOLIN HFA) 108 (90 BASE) MCG/ACT INHALER    Inhale 1-2 puffs into the lungs every 6 (six) hours as needed for wheezing or shortness of breath (cough).   BENZONATATE  (TESSALON) 100 MG CAPSULE    Take 1 capsule (100 mg total) by mouth 3 (three) times daily as needed for cough.   FLUTICASONE (FLONASE) 50 MCG/ACT NASAL SPRAY    Place 2 sprays into both nostrils daily for 7 days.    Note:  This document was prepared using Dragon voice recognition software and may include unintentional dictation errors.  Nanda Quinton, MD Emergency Medicine    Maame Dack, [facility], MD 09/27/18 [phone]

## 2018-09-27 NOTE — ED Triage Notes (Signed)
Cough x 2 days. Productive with yellow sputum.

## 2018-09-27 NOTE — Discharge Instructions (Signed)
You were diagnosed with the flu (influenza) or similar viral illness.  You will feel ill for as much as a few weeks.  Please take any prescribed medications as instructed, and you may use over-the-counter Tylenol and/or ibuprofen as needed according to label instructions (unless you have an allergy to either or have been told by your doctor not to take them).  Follow up with your physician as instructed above, and return to the Emergency Department (ED) if you are unable to tolerate fluids due to vomiting, have worsening trouble breathing, become extremely tired or difficult to awaken, or if you develop any other symptoms that concern you.

## 2018-12-02 IMAGING — NM NM [ID] THYROID CANCER METS SP CA TX
6 series · 6 of 6 positions shown · non-contrast
Comparison: None.

CLINICAL DATA: Nine days status post I 131 therapy and status post
total thyroidectomy for thyroid cancer.

EXAM:
NUCLEAR MEDICINE Y-R7R POST THERAPY WHOLE BODY SCAN
TECHNIQUE: The patient received 75.6 mCi Y-R7R sodium iodide for the treatment
of thyroid cancer within the past 10 days. The patient returns
today, and whole body scanning was performed in the anterior and
posterior projections.

[Series 1: marker · 4.14mm/px · 1 of 1 slices shown (1 of 2)]
[im 1/1]
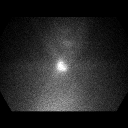

[Series 1: marker · 4.14mm/px · 1 of 1 slices shown (2 of 2)]
[im 1/1]
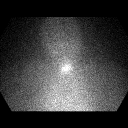

[Series 2: static thyroid no marker · 4.14mm/px · 1 of 1 slices shown (1 of 2)]
[im 1/1]
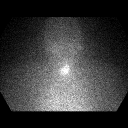

[Series 2: static thyroid no marker · 4.14mm/px · 1 of 1 slices shown (2 of 2)]
[im 1/1]
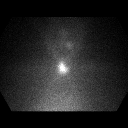

[Series 3: i131 whole body · 2.66mm/px · 1 of 1 slices shown (1 of 2)]
[im 1/1  full-range]
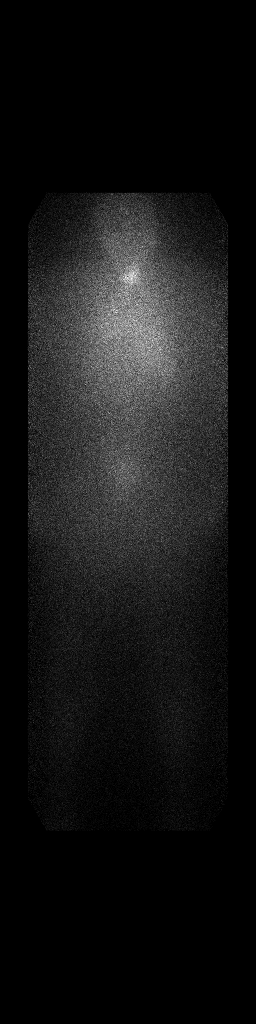

[Series 3: i131 whole body · 2.66mm/px · 1 of 1 slices shown (2 of 2)]
[im 1/1]
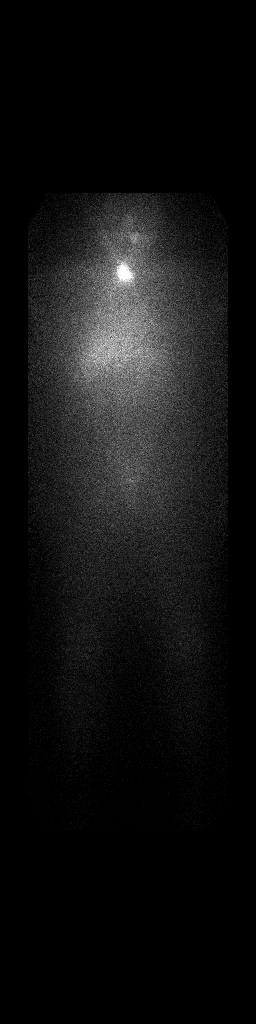

[6 of 6 positions shown; findings below may reference images not displayed]

FINDINGS: Expected activity in the thyroid bed likely due to residual thyroid
tissue. Uptake is noted in the liver and likely due to fatty
infiltration. No evidence of adenopathy in the neck or abnormal
uptake in the lungs, abdomen or pelvis.
IMPRESSION: Expected uptake in the thyroid bed but no findings to suggest
metastatic disease.

## 2019-11-02 ENCOUNTER — Emergency Department (HOSPITAL_BASED_OUTPATIENT_CLINIC_OR_DEPARTMENT_OTHER): Payer: Self-pay

## 2019-11-02 ENCOUNTER — Other Ambulatory Visit: Payer: Self-pay

## 2019-11-02 ENCOUNTER — Emergency Department (HOSPITAL_BASED_OUTPATIENT_CLINIC_OR_DEPARTMENT_OTHER)
Admission: EM | Admit: 2019-11-02 | Discharge: 2019-11-02 | Disposition: A | Discharge status: Home / Self Care | Payer: Self-pay | Attending: Emergency Medicine | Admitting: Emergency Medicine

## 2019-11-02 ENCOUNTER — Encounter (HOSPITAL_BASED_OUTPATIENT_CLINIC_OR_DEPARTMENT_OTHER): Payer: Self-pay | Admitting: Emergency Medicine

## 2019-11-02 DIAGNOSIS — Y9389 Activity, other specified: Secondary | ICD-10-CM | POA: Insufficient documentation

## 2019-11-02 DIAGNOSIS — W19XXXA Unspecified fall, initial encounter: Secondary | ICD-10-CM

## 2019-11-02 DIAGNOSIS — Z880 Allergy status to penicillin: Secondary | ICD-10-CM | POA: Insufficient documentation

## 2019-11-02 DIAGNOSIS — E039 Hypothyroidism, unspecified: Secondary | ICD-10-CM | POA: Insufficient documentation

## 2019-11-02 DIAGNOSIS — W010XXA Fall on same level from slipping, tripping and stumbling without subsequent striking against object, initial encounter: Secondary | ICD-10-CM | POA: Insufficient documentation

## 2019-11-02 DIAGNOSIS — S50312A Abrasion of left elbow, initial encounter: Secondary | ICD-10-CM | POA: Insufficient documentation

## 2019-11-02 DIAGNOSIS — Y999 Unspecified external cause status: Secondary | ICD-10-CM | POA: Insufficient documentation

## 2019-11-02 DIAGNOSIS — Z79899 Other long term (current) drug therapy: Secondary | ICD-10-CM | POA: Insufficient documentation

## 2019-11-02 DIAGNOSIS — M25512 Pain in left shoulder: Secondary | ICD-10-CM | POA: Insufficient documentation

## 2019-11-02 DIAGNOSIS — T148XXA Other injury of unspecified body region, initial encounter: Secondary | ICD-10-CM

## 2019-11-02 DIAGNOSIS — Y929 Unspecified place or not applicable: Secondary | ICD-10-CM | POA: Insufficient documentation

## 2019-11-02 DIAGNOSIS — Z885 Allergy status to narcotic agent status: Secondary | ICD-10-CM | POA: Insufficient documentation

## 2019-11-02 DIAGNOSIS — Z85850 Personal history of malignant neoplasm of thyroid: Secondary | ICD-10-CM | POA: Insufficient documentation

## 2019-11-02 DIAGNOSIS — M25532 Pain in left wrist: Secondary | ICD-10-CM | POA: Insufficient documentation

## 2019-11-02 MED ORDER — HYDROCODONE-ACETAMINOPHEN 5-325 MG PO TABS: 1.0000 | ORAL_TABLET | Freq: Four times a day (QID) | ORAL | 0 refills | Status: AC | PRN

## 2019-11-02 NOTE — ED Notes (Signed)
Pt discharged to home. Discharge instructions have been discussed with patient. Pt verbally acknowledges understanding d/c instructions.

## 2019-11-02 NOTE — ED Notes (Signed)
ED Provider at bedside. 

## 2019-11-02 NOTE — ED Provider Notes (Signed)
[facility] Provider Note   CSN: [id number] Arrival date & time: 11/02/19  1636     History Chief Complaint  Patient presents with  . Fall    [facility] is a 44 y.o. female with history of von Willebrand's disease, thyroid disorder, migraine headaches presents for evaluation of acute onset, progressively worsening left wrist pain due to fall yesterday.  She reports that at around 9 PM she was attempting to move her daughter's suitcase when she fell backwards over a concrete parking block/wheelstop, landing with her body weight on her left wrist.  Denies head injury or loss of consciousness.  She reports superficial abrasion to the left elbow but otherwise no significant elbow pain.  Notes mild soreness to the left shoulder but has no difficulty ranging the shoulder.  Notes paresthesias to the fingers of the left hand and constant aching pain to the left wrist, worsens with flexion and extension.  Has applied ice and taken Tylenol with little relief.  She is right-hand dominant.   The history is provided by the patient.       Past Medical History:  Diagnosis Date  . Hypothyroidism   . Migraine   . Thyroid cancer ([place])   . Von Willebrand disease ([place])     There are no problems to display for this patient.   Past Surgical History:  Procedure Laterality Date  . CESAREAN SECTION    . CHOLECYSTECTOMY    . THYROIDECTOMY       OB History   No obstetric history on file.     Family History  Problem Relation Age of Onset  . Hypertension Other   . Diabetes Other   . Cancer Other   . Breast cancer Neg Hx     Social History   Tobacco Use  . Smoking status: Never Smoker  . Smokeless tobacco: Never Used  Substance Use Topics  . Alcohol use: Not Currently    Comment: rarely  . Drug use: No    Home Medications Prior to Admission medications   Medication Sig Start Date End Date Taking? Authorizing Provider  albuterol (PROVENTIL  HFA;VENTOLIN HFA) 108 (90 Base) MCG/ACT inhaler Inhale 1-2 puffs into the lungs every 6 (six) hours as needed for wheezing or shortness of breath (cough). 09/27/18   Long, Wonda Olds, MD  benzonatate (TESSALON) 100 MG capsule Take 1 capsule (100 mg total) by mouth 3 (three) times daily as needed for cough. 09/27/18   Long, Wonda Olds, MD  fluticasone (FLONASE) 50 MCG/ACT nasal spray Place 2 sprays into both nostrils daily for 7 days. 09/27/18 10/04/18  Long, Wonda Olds, MD  HYDROcodone-acetaminophen (NORCO/VICODIN) 5-325 MG tablet Take 1 tablet by mouth every 6 (six) hours as needed for severe pain. 11/02/19   Jaileigh Weimer A, PA-C  hydrOXYzine (ATARAX/VISTARIL) 25 MG tablet Take 1 tablet (25 mg total) by mouth every 4 (four) hours as needed for itching (may cause drowsiness). 01/24/18   Molpus, John, MD  levothyroxine (SYNTHROID) 112 MCG tablet Take by mouth. 12/05/17   [provider]    Allergies    Dilaudid [hydromorphone hcl] and Penicillins  Review of Systems   Review of Systems  Constitutional: Negative for chills and fever.  Musculoskeletal: Positive for arthralgias.  Neurological: Positive for numbness. Negative for weakness.  All other systems reviewed and are negative.   Physical Exam Updated Vital Signs BP (!) 142/78 (BP Location: Right Arm)   Pulse (!) 102   Temp 98.3  F (36.8 C) (Oral)   Resp 18   Ht 5' 8.5" (1.74 m)   Wt 131.5 kg   SpO2 100%   BMI 43.45 kg/m   Physical Exam Vitals and nursing note reviewed.  Constitutional:      General: She is not in acute distress.    Appearance: She is well-developed.  HENT:     Head: Normocephalic and atraumatic.     Comments: No Battle's signs, no raccoon's eyes, no rhinorrhea.  Eyes:     General:        Right eye: No discharge.        Left eye: No discharge.     Conjunctiva/sclera: Conjunctivae normal.  Neck:     Vascular: No JVD.     Trachea: No tracheal deviation.  Cardiovascular:     Rate and Rhythm: Normal rate.   Pulmonary:     Effort: Pulmonary effort is normal.  Abdominal:     General: There is no distension.  Musculoskeletal:        General: Tenderness present.     Cervical back: Normal range of motion and neck supple. No rigidity or tenderness.     Comments: Swelling and tenderness noted to the left wrist with left snuffbox tenderness noted as well.  Limitation with active flexion and extension of the left wrist but she is able to flex and extend the wrist and digits against resistance.  Good grip strength bilaterally.  Superficial abrasion noted to the olecranon process of the left elbow but no tenderness to palpation of the left elbow.  Normal active range of motion of the left elbow.  Mild discomfort on palpation of the left shoulder along the trapezius.  Normal active and passive range of motion.  Skin:    General: Skin is warm and dry.     Capillary Refill: Capillary refill takes less than 2 seconds.     Findings: No erythema.  Neurological:     Mental Status: She is alert.  Psychiatric:        Behavior: Behavior normal.     ED Results / Procedures / Treatments   Labs (all labs ordered are listed, but only abnormal results are displayed) Labs Reviewed - No data to display  EKG None  Radiology DG Wrist Complete Left  Result Date: 11/02/2019 CLINICAL DATA:  [place] onto outstretched left hand last night. Complaining of pain. EXAM: LEFT WRIST - COMPLETE 3+ VIEW COMPARISON:  None. FINDINGS: There is no evidence of fracture or dislocation. There is no evidence of arthropathy or other focal bone abnormality. Soft tissues are unremarkable. IMPRESSION: Negative. Electronically Signed   By: Lajean Manes M.D.   On: 11/02/2019 17:14    Procedures Procedures (including critical care time)  Medications Ordered in ED Medications - No data to display  ED Course  I have reviewed the triage vital signs and the nursing notes.  Pertinent labs & imaging results that were available during my  care of the patient were reviewed by me and considered in my medical decision making (see chart for details).    MDM Rules/Calculators/A&P                      Patient presenting for evaluation of left upper extremity pain secondary to mechanical fall yesterday.  She is afebrile, initially mildly tachycardic upon triage but was not tachycardic on my assessment.  She is nontoxic in appearance.  Compartments are soft, no signs of secondary skin infection.  No  head injury or loss of consciousness.  No midline spine tenderness.  She has mild soreness on palpation of the left shoulder posteriorly but normal range of motion I doubt fracture or dislocation.  She would like to hold off on radiographs of the shoulder at this time which I think is reasonable but encouraged her to obtain reevaluation of her shoulder if this pain persists.  Radiographs of the left wrist show no evidence of fracture or dislocation.  However she does have snuffbox tenderness on examination so we will treat this conservatively as a presumed scaphoid fracture and place her in a thumb spica splint.  She will follow up with the hand surgeon on an outpatient basis.  Discussed conservative therapy and management with splinting, ice, elevation.  She avoids NSAIDs due to her history of von Willebrand's disease.  We will give a small amount of hydrocodone as needed for severe breakthrough pain which she reports she has tolerated previously without difficulty.  We discussed appropriate use of this medication and potential side effects.  She will follow-up with a hand surgeon or her PCP for repeat radiographs.  Discussed strict ED return precautions. Patient verbalized understanding of and agreement with plan and is safe for discharge home at this time.    Final Clinical Impression(s) / ED Diagnoses Final diagnoses:  Fall, initial encounter  Acute pain of left wrist  Skin abrasion  Acute pain of left shoulder    Rx / DC Orders ED Discharge  Orders         Ordered    HYDROcodone-acetaminophen (NORCO/VICODIN) 5-325 MG tablet  Every 6 hours PRN     11/02/19 1744           Renita Papa, PA-C 11/03/19 [id number]    Quintella Reichert, MD 11/03/19 1455

## 2019-11-02 NOTE — ED Triage Notes (Signed)
Tripped and fell today. C/o L wrist and shoulder pain.

## 2019-11-02 NOTE — ED Notes (Signed)
Ice applied

## 2019-11-02 NOTE — Discharge Instructions (Addendum)
Your x-rays today did not show any sign of fracture.  However we will treat your symptoms conservatively as a possible scaphoid fracture as these sometimes do not show up on initial x-rays.  Wear the splint at all times except when showering.  You can also elevate the extremity and apply ice 20 minutes at a time a few times daily.  You can take 2 tablets of extra strength Tylenol every 6 hours as needed for pain.  You can take hydrocodone as needed for severe breakthrough pain but do not drive, drink alcohol, or operate heavy machinery while taking this medication as it can cause drowsiness.  Can also cause constipation so take with a stool softener if it causes the side effect.  I typically recommend only taking this medicine at night to help you get to sleep.  Call your primary care provider or the hand surgeon Dr. Fredna Dow to schedule follow-up appointment for reevaluation.  You will likely need a repeat x-ray in 7 to 10 days to make sure there is no underlying fracture.  Return to the emergency department if any concerning signs or symptoms develop such as severe swelling, loss of pulses, severe pain or weakness.
